# Patient Record
Sex: Male | Born: 1965 | Race: White | Hispanic: No | Marital: Married | Smoking: Former smoker
Health system: Southern US, Community
[De-identification: ages and names within clinical notes are randomized; demographics above are authoritative.]

## PROBLEM LIST (undated history)

## (undated) DIAGNOSIS — I10 Essential (primary) hypertension: Secondary | ICD-10-CM

---

## 2008-01-30 ENCOUNTER — Ambulatory Visit: Payer: Self-pay | Admitting: Family Medicine

## 2014-01-08 ENCOUNTER — Emergency Department (HOSPITAL_COMMUNITY)
Admission: EM | Admit: 2014-01-08 | Discharge: 2014-01-08 | Disposition: A | Discharge status: Home / Self Care | Payer: BC Managed Care – PPO | Attending: Emergency Medicine | Admitting: Emergency Medicine

## 2014-01-08 ENCOUNTER — Encounter (HOSPITAL_COMMUNITY): Payer: Self-pay | Admitting: Emergency Medicine

## 2014-01-08 ENCOUNTER — Emergency Department (HOSPITAL_COMMUNITY): Payer: BC Managed Care – PPO

## 2014-01-08 DIAGNOSIS — R0789 Other chest pain: Secondary | ICD-10-CM

## 2014-01-08 DIAGNOSIS — Z87891 Personal history of nicotine dependence: Secondary | ICD-10-CM | POA: Insufficient documentation

## 2014-01-08 DIAGNOSIS — R079 Chest pain, unspecified: Secondary | ICD-10-CM | POA: Insufficient documentation

## 2014-01-08 DIAGNOSIS — Z79899 Other long term (current) drug therapy: Secondary | ICD-10-CM | POA: Insufficient documentation

## 2014-01-08 DIAGNOSIS — R209 Unspecified disturbances of skin sensation: Secondary | ICD-10-CM | POA: Insufficient documentation

## 2014-01-08 LAB — CBC
HCT: 42.3 % (ref 39.0–52.0)
Hemoglobin: 14.8 g/dL (ref 13.0–17.0)
MCH: 31 pg (ref 26.0–34.0)
MCHC: 35 g/dL (ref 30.0–36.0)
MCV: 88.7 fL (ref 78.0–100.0)
PLATELETS: 271 10*3/uL (ref 150–400)
RBC: 4.77 MIL/uL (ref 4.22–5.81)
RDW: 11.9 % (ref 11.5–15.5)
WBC: 7.4 10*3/uL (ref 4.0–10.5)

## 2014-01-08 LAB — BASIC METABOLIC PANEL
ANION GAP: 12 (ref 5–15)
BUN: 14 mg/dL (ref 6–23)
CALCIUM: 9.9 mg/dL (ref 8.4–10.5)
CO2: 27 mEq/L (ref 19–32)
CREATININE: 0.83 mg/dL (ref 0.50–1.35)
Chloride: 98 mEq/L (ref 96–112)
GFR calc Af Amer: >90 mL/min (ref >90)
GFR calc non Af Amer: >90 mL/min (ref >90)
Glucose, Bld: 99 mg/dL (ref 70–99)
Potassium: 4.1 mEq/L (ref 3.7–5.3)
Sodium: 137 mEq/L (ref 137–147)

## 2014-01-08 LAB — I-STAT TROPONIN, ED
Troponin i, poc: 0 ng/mL (ref 0.00–0.08)
Troponin i, poc: 0 ng/mL (ref 0.00–0.08)

## 2014-01-08 NOTE — Discharge Instructions (Signed)
Please follow up with the [facility] in the next week. They will call you tomorrow with an appointment.  Chest Pain (Nonspecific) It is often hard to give a diagnosis for the cause of chest pain. There is always a chance that your pain could be related to something serious, such as a heart attack or a blood clot in the lungs. You need to follow up with your doctor. HOME CARE  If antibiotic medicine was given, take it as directed by your doctor. Finish the medicine even if you start to feel better.  For the next few days, avoid activities that bring on chest pain. Continue physical activities as told by your doctor.  Do not use any tobacco products. This includes cigarettes, chewing tobacco, and e-cigarettes.  Avoid drinking alcohol.  Only take medicine as told by your doctor.  Follow your doctor's suggestions for more testing if your chest pain does not go away.  Keep all doctor visits you made. GET HELP IF:  Your chest pain does not go away, even after treatment.  You have a rash with blisters on your chest.  You have a fever. GET HELP RIGHT AWAY IF:   You have more pain or pain that spreads to your arm, neck, jaw, back, or belly (abdomen).  You have shortness of breath.  You cough more than usual or cough up blood.  You have very bad back or belly pain.  You feel sick to your stomach (nauseous) or throw up (vomit).  You have very bad weakness.  You pass out (faint).  You have chills. This is an emergency. Do not wait to see if the problems will go away. Call your local emergency services (911 in [place] Do not drive yourself to the hospital. MAKE SURE YOU:   Understand these instructions.  Will watch your condition.  Will get help right away if you are not doing well or get worse. Document Released: 11/11/2007 Document Revised: 05/30/2013 Document Reviewed: 11/11/2007 [facility] Patient Information 2015 [place]. This information is not intended to  replace advice given to you by your health care provider. Make sure you discuss any questions you have with your health care provider.

## 2014-01-08 NOTE — Progress Notes (Signed)
  CARE MANAGEMENT ED NOTE 01/08/2014  Patient:  Dave Hudson,Dave Hudson   Account Number:  [id number]  Date Initiated:  01/08/2014  Documentation initiated by:  Edd ArbourGIBBS,Ausar Georgiou  Subjective/Objective Assessment:   48 yr old bcbs [place] pt went to PCP for chest pain that started today, had numbness and tingling in left arm and face. Pt also states behind his ear in the inside there was has been and itch in conjunction with     Subjective/Objective Assessment Detail:   with chest pain. Pt sent from PCP for labs. pcp per pt is Dr Carilyn Goodpastureibas    Pt states he feels fine when CM visited him, wife and children at bedside Pt recently had lots of visits with a Cancer family member who lost "her fight" Pt states he just wants to make sure he is "okay"     Action/Plan:   updated cp in epic   Action/Plan Detail:   Anticipated DC Date:  01/08/2014     Status Recommendation to Physician:   Result of Recommendation:    Other ED Services  Consult Working Plan    DC Planning Services  Other  PCP issues  Outpatient Services - Pt will follow up    Choice offered to / List presented to:            Status of service:  Completed, signed off  ED Comments:   ED Comments Detail:

## 2014-01-08 NOTE — ED Notes (Addendum)
Pt went to PCP for chest pain that started today, had numbness and tingling in left arm and face. Pt also states behind his ear in the inside there was has been and itch in conjunction with chest pain. Pt sent from PCP for labs.

## 2014-01-08 NOTE — ED Provider Notes (Signed)
CSN: [id number]     Arrival date & time 01/08/14  1232 History   First MD Initiated Contact with Patient 01/08/14 1357     Chief Complaint  Patient presents with  . Chest Pain  . Tingling     (Consider location/radiation/quality/duration/timing/severity/associated sxs/prior Treatment) HPI Comments: Has had a few episodes of chest pain over past 8 months - happened first time about 8 years ago - of outward pressure in the chest, L arm and face tingling. Resolves after about 6 hours. Patient here with similar episode to all his prior episodes of this - no worsening. Sent in by PCP for enzymes and since his BP was elevated.  Patient is a 48 y.o. male presenting with chest pain. The history is provided by the patient.  Chest Pain Pain location:  Substernal area Pain quality: pressure (outward pressure from inside)   Pain radiates to:  Does not radiate Pain radiates to the back: no   Pain severity:  Moderate Onset quality:  Gradual Timing:  Constant Progression:  Resolved Chronicity:  Recurrent Context: at rest   Relieved by:  Nothing Worsened by:  Nothing tried Associated symptoms: no abdominal pain, no cough, no fever, no shortness of breath and not vomiting     History reviewed. No pertinent past medical history. History reviewed. No pertinent past surgical history. No family history on file. History  Substance Use Topics  . Smoking status: Former Games developermoker  . Smokeless tobacco: Not on file  . Alcohol Use: Yes    Review of Systems  Constitutional: Negative for fever and chills.  Respiratory: Negative for cough and shortness of breath.   Cardiovascular: Positive for chest pain. Negative for leg swelling.  Gastrointestinal: Negative for vomiting and abdominal pain.  All other systems reviewed and are negative.     Allergies  Review of patient's allergies indicates no known allergies.  Home Medications   Prior to Admission medications   Medication Sig Start Date End  Date Taking? Authorizing Provider  acetaminophen (TYLENOL) 500 MG tablet Take 500 mg by mouth every 6 (six) hours as needed for moderate pain.   Yes Historical Provider, MD  citalopram (CELEXA) 20 MG tablet Take 20 mg by mouth daily.   Yes Historical Provider, MD  Multiple Vitamin (MULTIVITAMIN WITH MINERALS) TABS tablet Take 1 tablet by mouth daily.   Yes Historical Provider, MD  naproxen sodium (ANAPROX) 220 MG tablet Take 220 mg by mouth every 4 (four) hours as needed (pain).   Yes Historical Provider, MD  Omega-3 Fatty Acids (FISH OIL) 1200 MG CAPS Take 1 capsule by mouth daily.   Yes Historical Provider, MD  pravastatin (PRAVACHOL) 20 MG tablet Take 20 mg by mouth daily.   Yes Historical Provider, MD  Probiotic Product (PROBIOTIC DAILY PO) Take 1 capsule by mouth daily.   Yes Historical Provider, MD   BP 129/86  Pulse 71  Temp(Src) 98.4 F (36.9 C) (Oral)  Resp 16  SpO2 97% Physical Exam  Nursing note and vitals reviewed. Constitutional: He is oriented to person, place, and time. He appears well-developed and well-nourished. No distress.  HENT:  Head: Normocephalic and atraumatic.  Mouth/Throat: Oropharynx is clear and moist. No oropharyngeal exudate.  Eyes: EOM are normal. Pupils are equal, round, and reactive to light.  Neck: Normal range of motion. Neck supple.  Cardiovascular: Normal rate and regular rhythm.  Exam reveals no friction rub.   No murmur heard. Pulmonary/Chest: Effort normal and breath sounds normal. No respiratory distress. He has  no wheezes. He has no rales.  Abdominal: Soft. He exhibits no distension. There is no tenderness. There is no rebound.  Musculoskeletal: Normal range of motion. He exhibits no edema.  Neurological: He is alert and oriented to person, place, and time.  Skin: No rash noted. He is not diaphoretic.    ED Course  Procedures (including critical care time) Labs Review Labs Reviewed  CBC  BASIC METABOLIC PANEL  I-STAT TROPOININ, ED   Rosezena Sensor, ED    Imaging Review Dg Chest 2 View  01/08/2014   CLINICAL DATA:  Chest pain, former smoker  EXAM: CHEST  2 VIEW  COMPARISON:  None.  FINDINGS: The heart size and mediastinal contours are within normal limits. Both lungs are clear. The visualized skeletal structures are unremarkable.  IMPRESSION: No active cardiopulmonary disease.   Electronically Signed   By: Christiana Pellant M.D.   On: 01/08/2014 14:51     EKG Interpretation   Date/Time:  Monday January 08 2014 12:56:17 EDT Ventricular Rate:  55 PR Interval:  152 QRS Duration: 93 QT Interval:  435 QTC Calculation: 416 R Axis:   29 Text Interpretation:  Sinus rhythm Probable anteroseptal infarct, old  Baseline wander in lead(s) V2 No prior Confirmed by Gwendolyn Grant  MD, Adalea Handler  ([id number]) on 01/08/2014 2:20:21 PM      MDM   Final diagnoses:  Other chest pain    48 year old male here with atypical chest pain. All he has risk factors of hyperlipidemia and family history. He is not a smoker. Over the past several years he's had episodes like this, more frequently, for 5 times over the past 8 months. Episode lasted for 6 hours, described as outward pressure on his chest. He has associated left arm tingling and facial tingling. This all resolves after the chest pain resolves. This is following the same course as it has on every other time this has happened. Patient sent in today by his doctor because of elevated blood pressure, 160s over 100. Pressures normal here. No chest pain this time. He is describing an atypical picture. His EKG is normal and his first troponin is normal. We'll repeat second troponin. Cardiology contacted and will followup with appointment tomorrow. Second troponin negative. Exam okay. Stable for discharge.    Dagmar Hait, MD 01/08/14 [phone]

## 2014-01-30 ENCOUNTER — Encounter: Payer: Self-pay | Admitting: Cardiovascular Disease

## 2014-01-30 ENCOUNTER — Ambulatory Visit (INDEPENDENT_AMBULATORY_CARE_PROVIDER_SITE_OTHER): Payer: BC Managed Care – PPO | Admitting: Cardiovascular Disease

## 2014-01-30 VITALS — BP 134/100 | HR 80 | Ht 70.0 in | Wt 207.8 lb

## 2014-01-30 DIAGNOSIS — R079 Chest pain, unspecified: Secondary | ICD-10-CM | POA: Insufficient documentation

## 2014-01-30 DIAGNOSIS — R0789 Other chest pain: Secondary | ICD-10-CM

## 2014-01-30 DIAGNOSIS — I10 Essential (primary) hypertension: Secondary | ICD-10-CM

## 2014-01-30 DIAGNOSIS — R9431 Abnormal electrocardiogram [ECG] [EKG]: Secondary | ICD-10-CM

## 2014-01-30 DIAGNOSIS — E785 Hyperlipidemia, unspecified: Secondary | ICD-10-CM

## 2014-01-30 MED ORDER — POTASSIUM CHLORIDE ER 10 MEQ PO TBCR: 10.0000 meq | EXTENDED_RELEASE_TABLET | Freq: Every day | ORAL | Status: DC

## 2014-01-30 MED ORDER — HYDROCHLOROTHIAZIDE 25 MG PO TABS: 25.0000 mg | ORAL_TABLET | Freq: Every day | ORAL | Status: DC

## 2014-01-30 NOTE — Progress Notes (Signed)
Kenyon Ana Date of Birth  1966/04/02       [facility]    [place] [address]  [address] [place], [place]  [address]   [place], [place]  [address] [phone]     [phone]   Fax  [phone]     Fax [phone]  Problem List: 1. Chest pain 2. HTN 3. Hyperlipidemia   History of Present Illness:  Nasim presents today for further eval of his CP and HTN.    He has occasional episodes of chest tightness - similar to when you might swallow a pill that is too large and it scratches on the way down.  He also has left arm numbness associated with the CP.   He went to the ER and Troponin was negative.   CXR was negative.   He was observed for several hours.   This occurs sporadically over the past couple of years.   His BP has been higher over the past couple of months.   Her has had hyperlipidemia for years   He has a   family hx of CAD. His health is overall very good.    He is active - exercises irregularly 2-3 days a week , walking .  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 acetaminophen (TYLENOL) 500 MG tablet Take 500 mg by mouth every 6 (six) hours as needed for moderate pain.      . citalopram (CELEXA) 20 MG tablet Take 20 mg by mouth daily.      . Multiple Vitamin (MULTIVITAMIN WITH MINERALS) TABS tablet Take 1 tablet by mouth daily.      . Omega-3 Fatty Acids (FISH OIL) 1200 MG CAPS Take 1 capsule by mouth daily.      . pravastatin (PRAVACHOL) 20 MG tablet Take 20 mg by mouth daily.       No current facility-administered medications on file prior to visit.    No Known Allergies  No past medical history on file.  No past surgical history on  file.  History  Smoking status  . Former Smoker  Smokeless tobacco  . Not on file    History  Alcohol Use  . Yes    No family history on file.  Reviw of Systems:  Reviewed in the HPI.  All other systems are negative.  Physical Exam: Blood pressure 134/100, pulse 80, height  (1.778 m), weight 207 lb 12.8 oz (94.257 kg). Wt Readings from Last 3 Encounters:  01/30/14 207 lb 12.8 oz (94.257 kg)     General: Well developed, well nourished, in no acute distress.  Head: Normocephalic, atraumatic, sclera non-icteric, mucus membranes are moist,   Neck: Supple. Carotids are 2 + without bruits. No JVD   Lungs: Clear   Heart: RR, normal S1S2  Abdomen: Soft, non-tender, non-distended with normal bowel sounds.  Msk:  Strength and tone are normal   Extremities: No clubbing or cyanosis. No edema.  Distal pedal pulses are 2+ and equal    Neuro: CN II - XII intact.  Alert and oriented X 3.   Psych:  Normal   ECG: 01/09/14:  Sinus brady at 55.  Poor R wave progression.   Assessment / Plan:

## 2014-01-30 NOTE — Assessment & Plan Note (Signed)
Dave Hudson presents today for further evaluation of some episodes of chest tightness. These seem to occur at rest and occur at different occasions. They're not necessarily associated with exertion. He does have a history of hyperlipidemia, hypertension, and a family history of carotid disease. He was recently evaluated in the emergency room and he is poor R wave progression consistent with possible anterior wall myocardial infarction.  At this point we will schedule him for a stress Myoview study. I think we need to be more aggressive with his cholesterol management. He's currently on Pravachol but he knows that his numbers are not well controlled. We'll get a fasting lipids in several days and I anticipate starting him on atorvastatin.

## 2014-01-30 NOTE — Patient Instructions (Addendum)
Your physician has recommended you make the following change in your medication:  START HCTZ (Hydrochlorothiazide) 25 mg once daily - take in the morning START KDur (potassium supplement) 10 meq once daily - take with the HCTZ  Your physician has requested that you have en exercise stress myoview. For further information please visit [email or web address] Please follow instruction sheet, as given.  Your physician recommends that you return for lab work on:  Thursday 8/27 anytime between 7:30 am and 5:00 pm You will need to FAST for this appointment - nothing to eat or drink after midnight the night before except water.  Your physician recommends that you schedule a follow-up appointment in: 4-6 WEEKS with Dr. Elease Hashimoto.  You will also need to get lab work on this day (Basic Metabolic Panel)

## 2014-01-30 NOTE — Assessment & Plan Note (Signed)
His diastolic blood pressures been elevated for the past several months. He has gained a little bit of weight which is at least partially responsible for this hypertension. He does not eat a lot of salt. We'll start him on HCTZ 25 mg a day and potassium chloride 10 mg a day. I will see   him in 4-6 weeks for followup office visit and basic metabolic profile.

## 2014-02-01 ENCOUNTER — Other Ambulatory Visit (INDEPENDENT_AMBULATORY_CARE_PROVIDER_SITE_OTHER): Payer: BC Managed Care – PPO

## 2014-02-01 DIAGNOSIS — E785 Hyperlipidemia, unspecified: Secondary | ICD-10-CM

## 2014-02-01 DIAGNOSIS — R0789 Other chest pain: Secondary | ICD-10-CM

## 2014-02-01 DIAGNOSIS — I10 Essential (primary) hypertension: Secondary | ICD-10-CM

## 2014-02-01 LAB — LIPID PANEL
Cholesterol: 199 mg/dL (ref 0–200)
HDL: 30.3 mg/dL — AB (ref >39.00)
NONHDL: 168.7
TRIGLYCERIDES: 279 mg/dL — AB (ref 0.0–149.0)
Total CHOL/HDL Ratio: 7
VLDL: 55.8 mg/dL — ABNORMAL HIGH (ref 0.0–40.0)

## 2014-02-01 LAB — BASIC METABOLIC PANEL
BUN: 13 mg/dL (ref 6–23)
CALCIUM: 9.4 mg/dL (ref 8.4–10.5)
CO2: 30 mEq/L (ref 19–32)
CREATININE: 0.9 mg/dL (ref 0.4–1.5)
Chloride: 100 mEq/L (ref 96–112)
GFR: 101.1 mL/min (ref >60.00)
GLUCOSE: 98 mg/dL (ref 70–99)
Potassium: 4 mEq/L (ref 3.5–5.1)
Sodium: 136 mEq/L (ref 135–145)

## 2014-02-01 LAB — HEPATIC FUNCTION PANEL
ALBUMIN: 4.1 g/dL (ref 3.5–5.2)
ALT: 49 U/L (ref 0–53)
AST: 36 U/L (ref 0–37)
Alkaline Phosphatase: 46 U/L (ref 39–117)
Bilirubin, Direct: 0.1 mg/dL (ref 0.0–0.3)
TOTAL PROTEIN: 7.5 g/dL (ref 6.0–8.3)
Total Bilirubin: 1 mg/dL (ref 0.2–1.2)

## 2014-02-01 LAB — LDL CHOLESTEROL, DIRECT: Direct LDL: 120.3 mg/dL

## 2014-02-07 ENCOUNTER — Ambulatory Visit (HOSPITAL_COMMUNITY): Payer: BC Managed Care – PPO | Attending: Internal Medicine | Admitting: Radiology

## 2014-02-07 ENCOUNTER — Other Ambulatory Visit: Payer: Self-pay | Admitting: Nurse Practitioner

## 2014-02-07 VITALS — BP 134/93 | HR 51 | Ht 70.0 in | Wt 204.0 lb

## 2014-02-07 DIAGNOSIS — Z8249 Family history of ischemic heart disease and other diseases of the circulatory system: Secondary | ICD-10-CM | POA: Diagnosis not present

## 2014-02-07 DIAGNOSIS — R9431 Abnormal electrocardiogram [ECG] [EKG]: Secondary | ICD-10-CM | POA: Diagnosis not present

## 2014-02-07 DIAGNOSIS — R5381 Other malaise: Secondary | ICD-10-CM | POA: Insufficient documentation

## 2014-02-07 DIAGNOSIS — R5383 Other fatigue: Secondary | ICD-10-CM

## 2014-02-07 DIAGNOSIS — R0789 Other chest pain: Secondary | ICD-10-CM | POA: Diagnosis present

## 2014-02-07 MED ORDER — TECHNETIUM TC 99M SESTAMIBI GENERIC - CARDIOLITE
30.0000 | Freq: Once | INTRAVENOUS | Status: AC | PRN
  Administered 2014-02-07: 30 via INTRAVENOUS

## 2014-02-07 MED ORDER — ATORVASTATIN CALCIUM 40 MG PO TABS: 40.0000 mg | ORAL_TABLET | Freq: Every day | ORAL | Status: DC

## 2014-02-07 MED ORDER — TECHNETIUM TC 99M SESTAMIBI GENERIC - CARDIOLITE
10.0000 | Freq: Once | INTRAVENOUS | Status: AC | PRN
  Administered 2014-02-07: 10 via INTRAVENOUS

## 2014-02-07 NOTE — Progress Notes (Signed)
[facility] SITE 3 NUCLEAR MED [address] [place], [place] [address] [phone]    Cardiology Nuclear Med Study  Dave Hudson is a 48 y.o. male     MRN : [id number]     DOB: June 23, 1965  Procedure Date: 02/07/2014  Nuclear Med Background Indication for Stress Test:  Evaluation for Ischemia History:  No known CAD Cardiac Risk Factors: Family History - CAD, History of Smoking, Hypertension and Lipids  Symptoms:  Chest Tightness (last date of chest discomfort was two weeks ago)   Nuclear Pre-Procedure Caffeine/Decaff Intake:  None NPO After: 6 pm   Lungs:  clear O2 Sat: 95% on room air. IV 0.9% NS with Angio Cath:  22g  IV Site: R Hand  IV Started by:  Bonnita Levan, RN  Chest Size (in):  46 Cup Size: n/a  Height:  (1.778 m)  Weight:  204 lb (92.534 kg)  BMI:  Body mass index is 29.27 kg/(m^2). Tech Comments:  N/A    Nuclear Med Study 1 or 2 day study: 1 day  Stress Test Type:  Stress  Reading MD: N/A  Order Authorizing Provider:  Kristeen Miss, MD  Resting Radionuclide: Technetium 84m Sestamibi  Resting Radionuclide Dose: 11.0 mCi   Stress Radionuclide:  Technetium 82m Sestamibi  Stress Radionuclide Dose: 33.0 mCi           Stress Protocol Rest HR: 51 Stress HR: 155  Rest BP: 134/93 Stress BP: 178/99  Exercise Time (min): 12:00 METS: 13.4           Dose of Adenosine (mg):  n/a Dose of Lexiscan: n/a mg  Dose of Atropine (mg): n/a Dose of Dobutamine: n/a mcg/kg/min (at max HR)  Stress Test Technologist: Nelson Chimes, BS-ES  Nuclear Technologist:  Harlow Asa, CNMT     Rest Procedure:  Myocardial perfusion imaging was performed at rest 45 minutes following the intravenous administration of Technetium 21m Sestamibi. Rest ECG: NSR - Normal EKG  Stress Procedure:  The patient exercised on the treadmill utilizing the Bruce Protocol for 12:00 minutes. The patient stopped due to fatigue and denied any chest pain.  Technetium 2m Sestamibi was  injected at peak exercise and myocardial perfusion imaging was performed after a brief delay. Stress ECG: No significant change from baseline ECG  QPS Raw Data Images:  There is a moderate amount of motion artifact.  Stress Images:  Normal homogeneous uptake in all areas of the myocardium. Rest Images:  Normal homogeneous uptake in all areas of the myocardium. Subtraction (SDS):  No evidence of ischemia. Transient Ischemic Dilatation (Normal <1.22):  0.91 Lung/Heart Ratio (Normal <0.45):  0.40  Quantitative Gated Spect Images QGS EDV:  106 ml QGS ESV:  51 ml  Impression Exercise Capacity:  Excellent exercise capacity. BP Response:  Normal blood pressure response. Clinical Symptoms:  No significant symptoms noted. ECG Impression:  No significant ST segment change suggestive of ischemia. Comparison with Prior Nuclear Study: No images to compare  Overall Impression:  Normal stress nuclear study.  LV Ejection Fraction: 52%.  LV Wall Motion:  NL LV Function; NL Wall Motion.   Dave Hudson, Dave Hudson., MD, [facility] 02/07/2014, 5:05 PM [address],  [address] Office [phone] Pager [phone]

## 2014-02-07 NOTE — Progress Notes (Deleted)
OUTPATIENT IMAGING DISCHARGE INSTRUCTIONS   You can leave immediately.   Your activity will not be restricted.  You may drive if you wish, resume your normal diet, exercise and take all prescribed medications.  As a reminder, it is very important that you carry medication information at all times in the event of emergency situations.   The Nuclear / Echo study will be reviewed a physician who will send a report to your doctor.   Your doctor will contact you about the results of your procedure.

## 2014-02-08 ENCOUNTER — Telehealth: Payer: Self-pay | Admitting: Nurse Practitioner

## 2014-02-08 NOTE — Telephone Encounter (Signed)
Discussed results of myoview with patient and we discussed treatment plan from this point forward.  I reviewed Dr. Harvie Bridge plan to get the test results and to see how patient responds to HCTZ that was prescribed.  Patient states he believes his pain is related to stress and that his BP will get better with this medication, some dietary changes, and some stress relieving techniques.  Patient is scheduled for f/u on 10/7.  I advised patient to call back with questions or problems including increase in episodes, frequency, or intensity of chest pain or other concerns.  Patient verbalized understanding and agreement.

## 2014-02-08 NOTE — Telephone Encounter (Signed)
Message copied by Levi Aland on Thu Feb 08, 2014  2:08 PM ------      Message from: Vesta Mixer      Created: Thu Feb 08, 2014  9:58 AM       Normal myovie      w ------

## 2014-03-14 ENCOUNTER — Ambulatory Visit (INDEPENDENT_AMBULATORY_CARE_PROVIDER_SITE_OTHER): Payer: BC Managed Care – PPO | Admitting: Cardiovascular Disease

## 2014-03-14 ENCOUNTER — Encounter: Payer: Self-pay | Admitting: Cardiovascular Disease

## 2014-03-14 VITALS — BP 126/86 | HR 63 | Ht 70.0 in | Wt 211.0 lb

## 2014-03-14 DIAGNOSIS — E785 Hyperlipidemia, unspecified: Secondary | ICD-10-CM

## 2014-03-14 DIAGNOSIS — I10 Essential (primary) hypertension: Secondary | ICD-10-CM

## 2014-03-14 DIAGNOSIS — R0789 Other chest pain: Secondary | ICD-10-CM

## 2014-03-14 LAB — BASIC METABOLIC PANEL
BUN: 13 mg/dL (ref 6–23)
CO2: 26 mEq/L (ref 19–32)
Calcium: 9.5 mg/dL (ref 8.4–10.5)
Chloride: 102 mEq/L (ref 96–112)
Creatinine, Ser: 0.9 mg/dL (ref 0.4–1.5)
GFR: 102.43 mL/min (ref >60.00)
Glucose, Bld: 108 mg/dL — ABNORMAL HIGH (ref 70–99)
Potassium: 4.6 mEq/L (ref 3.5–5.1)
Sodium: 138 mEq/L (ref 135–145)

## 2014-03-14 NOTE — Assessment & Plan Note (Signed)
No further CP. myoview was negative

## 2014-03-14 NOTE — Assessment & Plan Note (Signed)
BP is fine.  Tolerating HCTZ well. Will check BMP today

## 2014-03-14 NOTE — Patient Instructions (Addendum)
Your physician wants you to follow-up in: 6 months with Dr. Elease HashimotoNahser. You will receive a reminder letter in the mail two months in advance. If you don't receive a letter, please call our office to schedule the follow-up appointment.  Your physician recommends that you have for lab work TODAY (BMET).  Your physician recommends that you continue on your current medications as directed. Please refer to the Current Medication list given to you today.

## 2014-03-14 NOTE — Progress Notes (Signed)
Dave Hudson Date of Birth  05/03/1966       [facility]    [place] [facility] [address]  [address] [place], [place]  [address]   [place], [place]  [id number] [phone]     [phone]   Fax  [phone]     Fax [phone]  Problem List: 1. Chest pain 2. HTN 3. Hyperlipidemia   History of Present Illness:  Dave Hudson presents today for further eval of his CP and HTN.    He has occasional episodes of chest tightness - similar to when you might swallow a pill that is too large and it scratches on the way down.  He also has left arm numbness associated with the CP.   He went to the ER and Troponin was negative.   CXR was negative.   He was observed for several hours.   This occurs sporadically over the past couple of years.   His BP has been higher over the past couple of months.   Her has had hyperlipidemia for years   He has a   family hx of CAD. His health is overall very good.    He is active - exercises irregularly 2-3 days a week , walking .  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  Oct. 7, 2015:  Dave Hudson is doing better.  We saw him in August for HTN and CP.  Added HCTZ.   Stress myoview was normal.  LV EF = 52%.   Watching his salt.  Tolerating the HCTZ well.  Drinking more water.  He has noticed that his  CP seems to be positional - last night had CP while lying in bed.   Resolved with change of position.  Current Outpatient Prescriptions on File Prior to Visit  Medication Sig Dispense Refill  . acetaminophen (TYLENOL) 500 MG tablet Take 500 mg by mouth every 6 (six) hours as needed for moderate pain.      Marland Kitchen. atorvastatin (LIPITOR) 40 MG tablet Take 1 tablet (40 mg total) by mouth daily at 6 PM.  90 tablet  3  . citalopram (CELEXA) 20 MG tablet  Take 20 mg by mouth daily.      . hydrochlorothiazide (HYDRODIURIL) 25 MG tablet Take 1 tablet (25 mg total) by mouth daily.  30 tablet  11  . Multiple Vitamin (MULTIVITAMIN WITH MINERALS) TABS tablet Take 1 tablet by mouth daily.      . Omega-3 Fatty Acids (FISH OIL) 1200 MG CAPS Take 1 capsule by mouth daily.      . potassium chloride (K-DUR) 10 MEQ tablet Take 1 tablet (10 mEq total) by mouth daily.  30 tablet  11   No current facility-administered medications on file prior to visit.    No Known Allergies  No past medical history on file.  No past surgical history on file.  History  Smoking status  . Former Smoker  Smokeless tobacco  . Not on file    History  Alcohol Use  . Yes    No family history on file.  Reviw of Systems:  Reviewed in the HPI.  All other systems are negative.  Physical Exam: Blood pressure 126/86, pulse 63, height 5\' 10"  (1.778 m), weight 211 lb (95.709 kg).  Wt Readings from Last 3 Encounters:  03/14/14 211 lb (95.709 kg)  02/07/14 204 lb (92.534 kg)  01/30/14 207 lb 12.8 oz (94.257 kg)     General: Well developed, well nourished, in no acute distress.  Head: Normocephalic, atraumatic, sclera non-icteric, mucus membranes are moist,   Neck: Supple. Carotids are 2 + without bruits. No JVD   Lungs: Clear   Heart: RR, normal S1S2  Abdomen: Soft, non-tender, non-distended with normal bowel sounds.  Msk:  Strength and tone are normal   Extremities: No clubbing or cyanosis. No edema.  Distal pedal pulses are 2+ and equal    Neuro: CN II - XII intact.  Alert and oriented X 3.   Psych:  Normal   ECG: 01/09/14:  Sinus brady at 55. Poor R wave progression.   Assessment / Plan:

## 2014-03-14 NOTE — Assessment & Plan Note (Signed)
Trigs are elevated. On fish oil capsules. Will continue diet, exercise program Will try to lose weight. i will check lipids again in 6 months with his next OV

## 2014-05-10 ENCOUNTER — Other Ambulatory Visit (INDEPENDENT_AMBULATORY_CARE_PROVIDER_SITE_OTHER): Payer: BC Managed Care – PPO | Admitting: *Deleted

## 2014-05-10 DIAGNOSIS — I10 Essential (primary) hypertension: Secondary | ICD-10-CM

## 2014-05-10 DIAGNOSIS — E785 Hyperlipidemia, unspecified: Secondary | ICD-10-CM

## 2014-05-10 LAB — LIPID PANEL
CHOLESTEROL: 167 mg/dL (ref 0–200)
HDL: 31.1 mg/dL — AB (ref >39.00)
NonHDL: 135.9
TRIGLYCERIDES: 211 mg/dL — AB (ref 0.0–149.0)
Total CHOL/HDL Ratio: 5
VLDL: 42.2 mg/dL — ABNORMAL HIGH (ref 0.0–40.0)

## 2014-05-10 LAB — BASIC METABOLIC PANEL
BUN: 12 mg/dL (ref 6–23)
CALCIUM: 9.5 mg/dL (ref 8.4–10.5)
CO2: 28 mEq/L (ref 19–32)
CREATININE: 1 mg/dL (ref 0.4–1.5)
Chloride: 101 mEq/L (ref 96–112)
GFR: 87.89 mL/min (ref >60.00)
GLUCOSE: 108 mg/dL — AB (ref 70–99)
POTASSIUM: 3.8 meq/L (ref 3.5–5.1)
Sodium: 138 mEq/L (ref 135–145)

## 2014-05-12 LAB — LDL CHOLESTEROL, DIRECT: LDL DIRECT: 96.3 mg/dL

## 2014-05-13 LAB — HEPATIC FUNCTION PANEL
ALBUMIN: 4.6 g/dL (ref 3.5–5.2)
ALT: 87 U/L — ABNORMAL HIGH (ref 0–53)
AST: 47 U/L — ABNORMAL HIGH (ref 0–37)
Alkaline Phosphatase: 53 U/L (ref 39–117)
BILIRUBIN TOTAL: 0.9 mg/dL (ref 0.2–1.2)
Bilirubin, Direct: 0.1 mg/dL (ref 0.0–0.3)
Total Protein: 7.6 g/dL (ref 6.0–8.3)

## 2015-02-08 ENCOUNTER — Other Ambulatory Visit: Payer: Self-pay | Admitting: Cardiovascular Disease

## 2015-03-06 ENCOUNTER — Other Ambulatory Visit: Payer: Self-pay | Admitting: Cardiovascular Disease

## 2015-03-17 ENCOUNTER — Other Ambulatory Visit: Payer: Self-pay | Admitting: Cardiovascular Disease

## 2015-05-04 ENCOUNTER — Other Ambulatory Visit: Payer: Self-pay | Admitting: Cardiovascular Disease

## 2015-05-12 ENCOUNTER — Other Ambulatory Visit: Payer: Self-pay | Admitting: Cardiovascular Disease

## 2015-06-19 IMAGING — CR DG CHEST 2V
2 series · 2 of 2 positions shown · non-contrast
Comparison: None.

CLINICAL DATA: Chest pain, former smoker

EXAM:
CHEST  2 VIEW

[w chest pa]
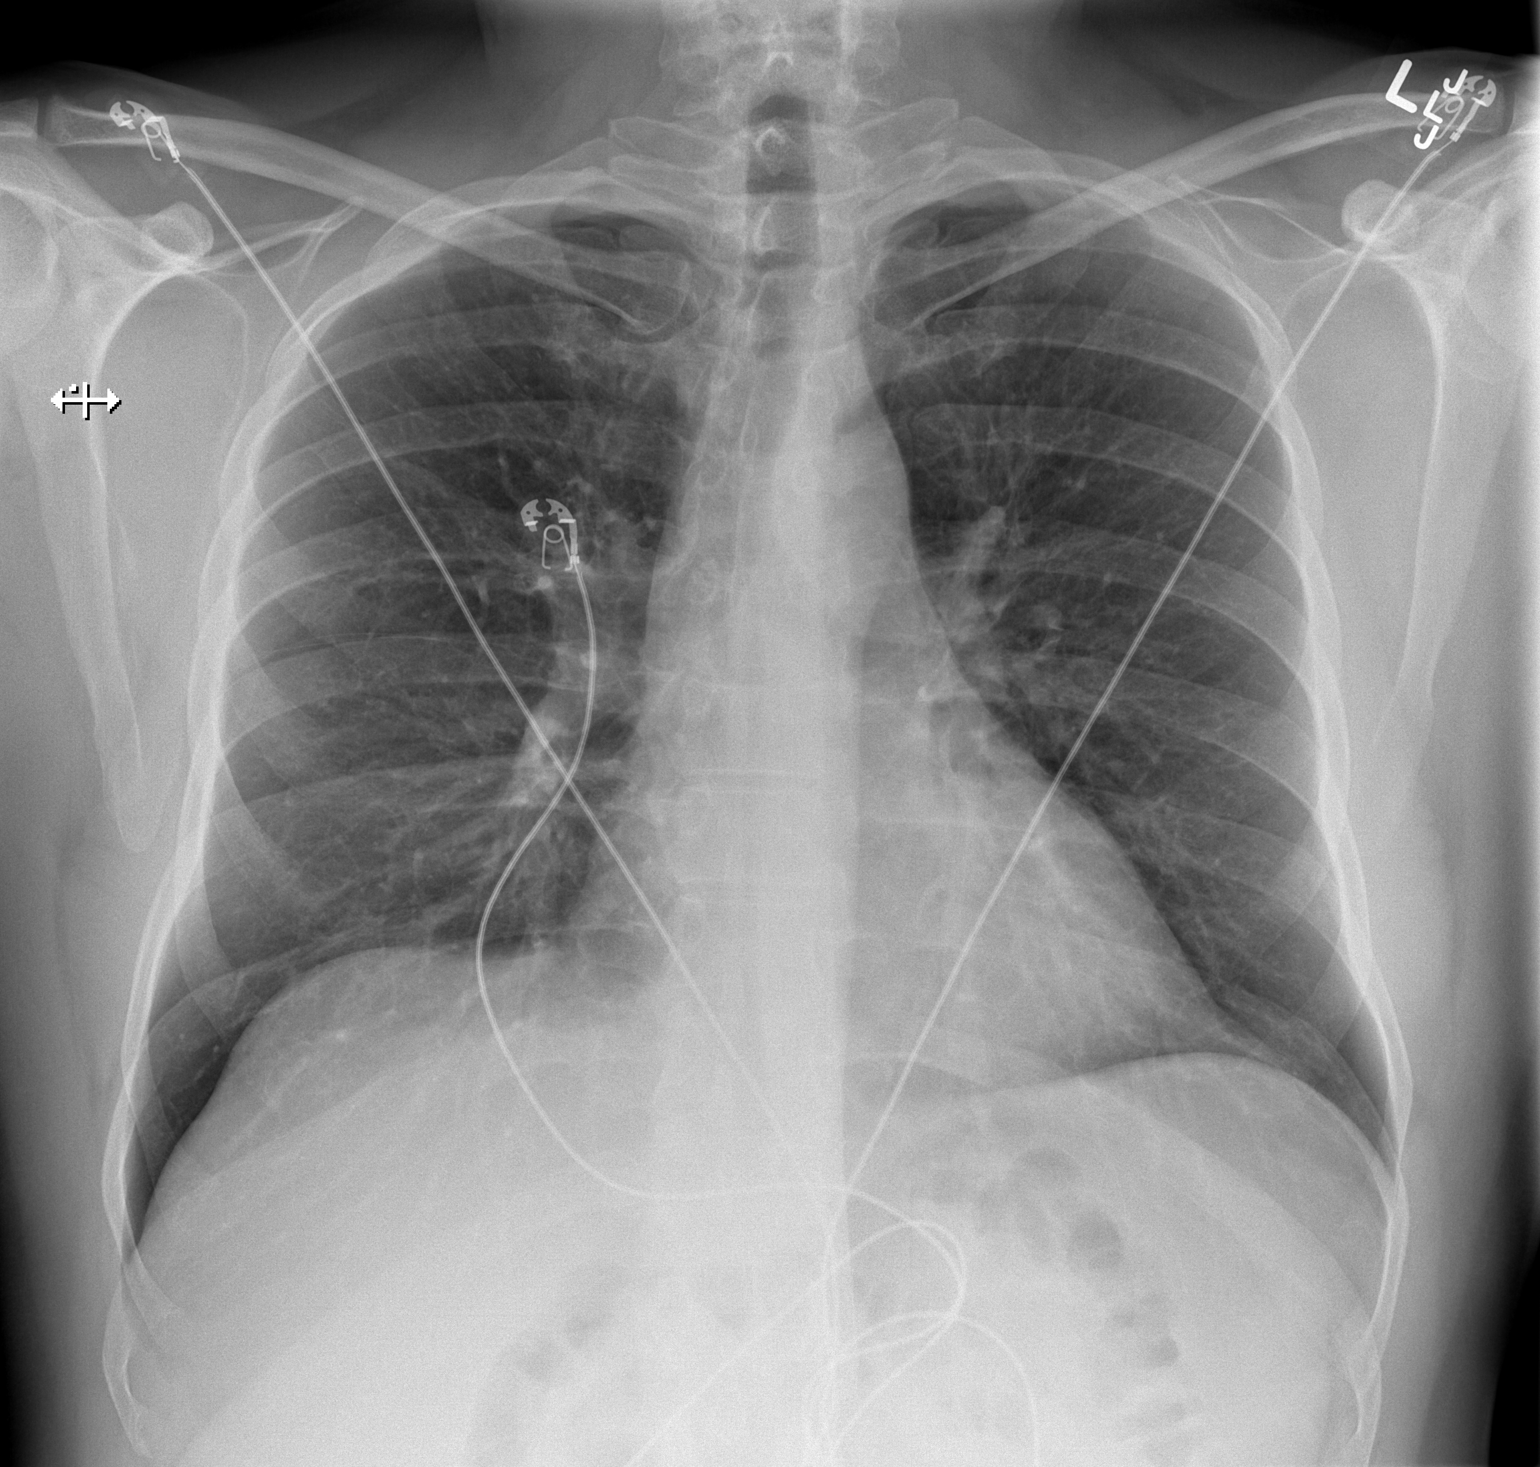

[w chest lat]
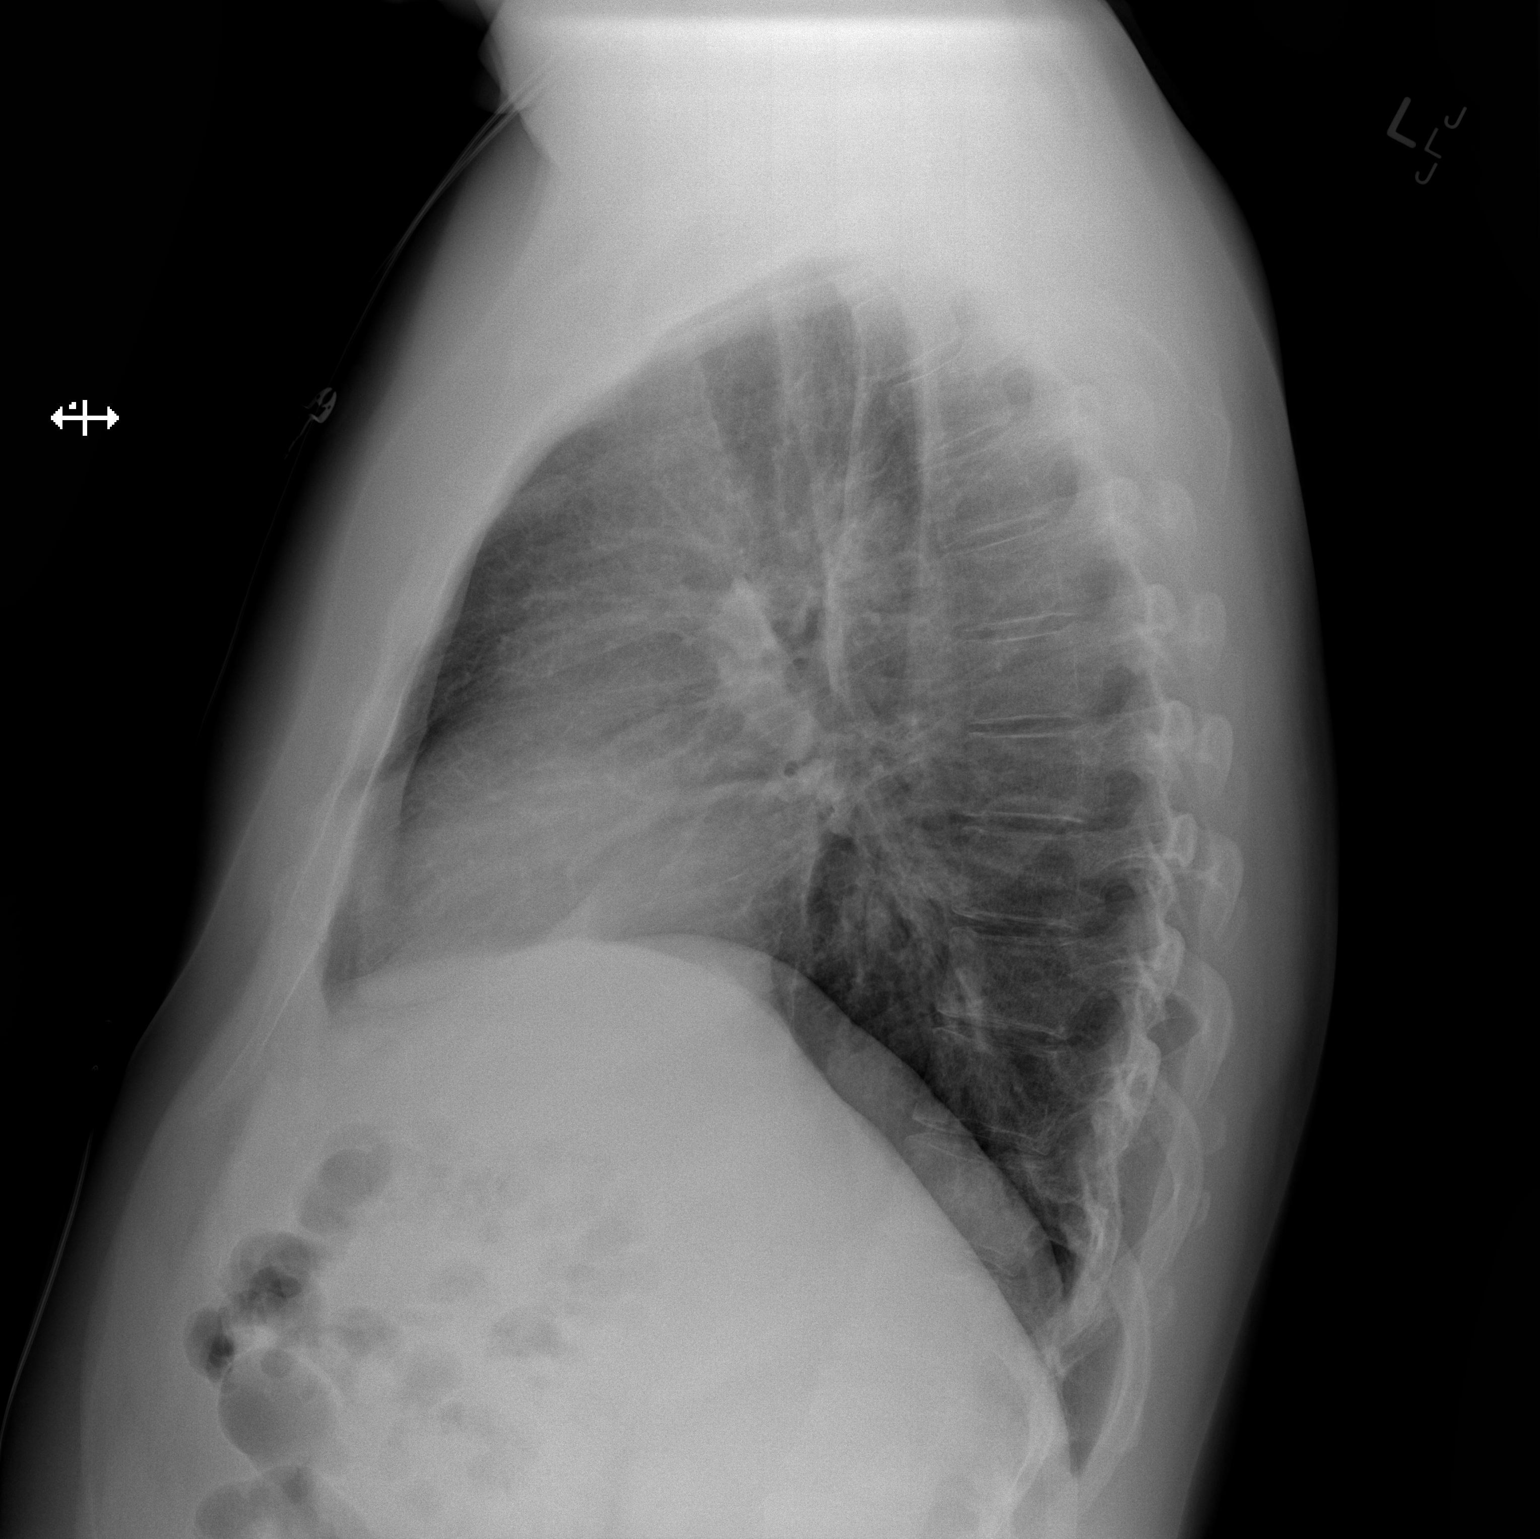

[2 of 2 positions shown; findings below may reference images not displayed]

FINDINGS: The heart size and mediastinal contours are within normal limits.
Both lungs are clear. The visualized skeletal structures are
unremarkable.
IMPRESSION: No active cardiopulmonary disease.

## 2015-12-17 ENCOUNTER — Ambulatory Visit
Admission: RE | Admit: 2015-12-17 | Discharge: 2015-12-17 | Disposition: A | Discharge status: Home / Self Care | Payer: Self-pay | Source: Ambulatory Visit | Attending: Family Medicine | Admitting: Family Medicine

## 2015-12-17 ENCOUNTER — Other Ambulatory Visit: Payer: Self-pay | Admitting: Family Medicine

## 2015-12-17 DIAGNOSIS — M7989 Other specified soft tissue disorders: Secondary | ICD-10-CM

## 2017-04-07 DIAGNOSIS — Z79899 Other long term (current) drug therapy: Secondary | ICD-10-CM | POA: Diagnosis not present

## 2017-04-07 DIAGNOSIS — E7800 Pure hypercholesterolemia, unspecified: Secondary | ICD-10-CM | POA: Diagnosis not present

## 2017-04-07 DIAGNOSIS — B351 Tinea unguium: Secondary | ICD-10-CM | POA: Diagnosis not present

## 2017-04-07 DIAGNOSIS — R1310 Dysphagia, unspecified: Secondary | ICD-10-CM | POA: Diagnosis not present

## 2017-04-07 DIAGNOSIS — F322 Major depressive disorder, single episode, severe without psychotic features: Secondary | ICD-10-CM | POA: Diagnosis not present

## 2017-05-19 DIAGNOSIS — R740 Nonspecific elevation of levels of transaminase and lactic acid dehydrogenase [LDH]: Secondary | ICD-10-CM | POA: Diagnosis not present

## 2017-05-19 DIAGNOSIS — E7800 Pure hypercholesterolemia, unspecified: Secondary | ICD-10-CM | POA: Diagnosis not present

## 2017-05-27 IMAGING — CR DG TOE GREAT 2+V*R*
2 series · 2 of 2 positions shown · non-contrast
Comparison: None.

CLINICAL DATA: Injury to right great toe. Stubbed toe. Swelling and
bruising at PIP joints

EXAM:
RIGHT GREAT TOE

[view not recorded (1 of 2)]
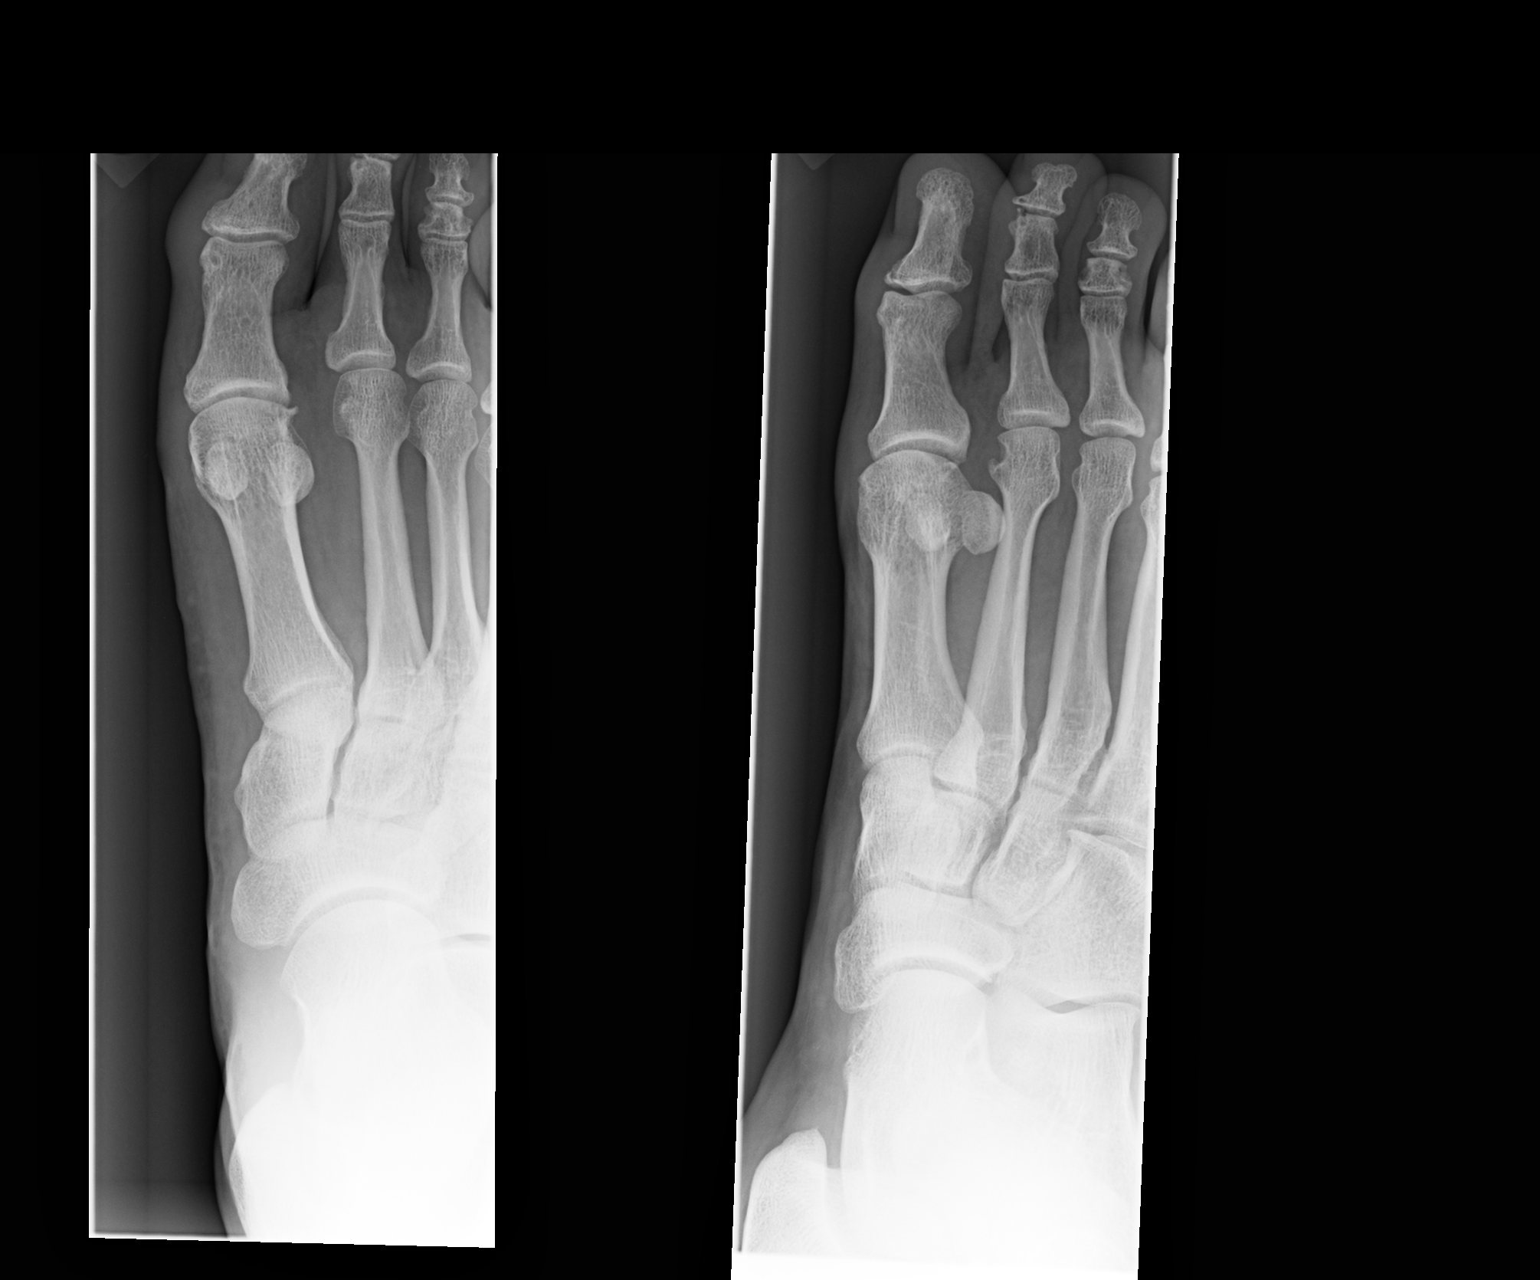

[view not recorded (2 of 2)]
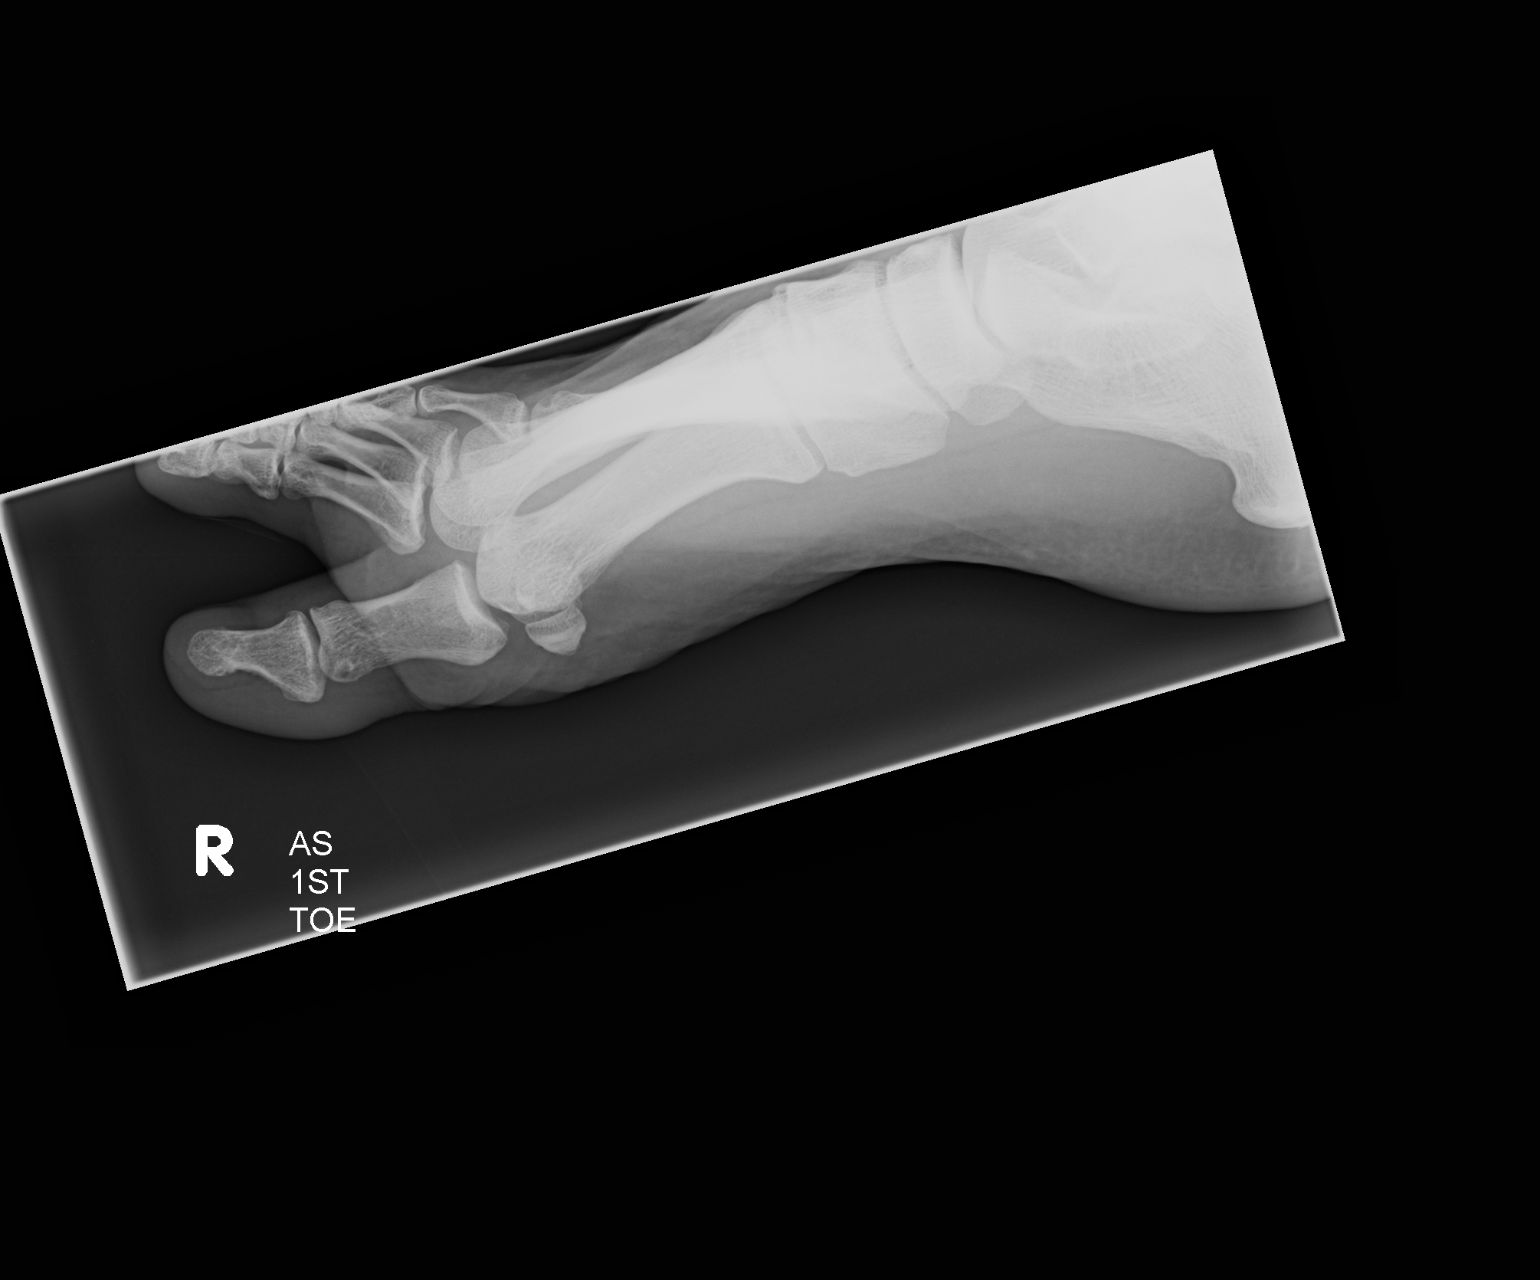

[2 of 2 positions shown; findings below may reference images not displayed]

FINDINGS: Mild degenerative changes at the first MTP joint. No acute bony
abnormality. Specifically, no fracture, subluxation, or dislocation.
Soft tissues are intact.
IMPRESSION: No acute bony abnormality.

## 2017-05-31 ENCOUNTER — Other Ambulatory Visit: Payer: Self-pay | Admitting: Family Medicine

## 2017-05-31 DIAGNOSIS — R740 Nonspecific elevation of levels of transaminase and lactic acid dehydrogenase [LDH]: Principal | ICD-10-CM

## 2017-05-31 DIAGNOSIS — R7401 Elevation of levels of liver transaminase levels: Secondary | ICD-10-CM

## 2017-10-13 DIAGNOSIS — E7800 Pure hypercholesterolemia, unspecified: Secondary | ICD-10-CM | POA: Diagnosis not present

## 2017-10-13 DIAGNOSIS — Z0000 Encounter for general adult medical examination without abnormal findings: Secondary | ICD-10-CM | POA: Diagnosis not present

## 2017-10-13 DIAGNOSIS — Z125 Encounter for screening for malignant neoplasm of prostate: Secondary | ICD-10-CM | POA: Diagnosis not present

## 2017-10-13 DIAGNOSIS — Z23 Encounter for immunization: Secondary | ICD-10-CM | POA: Diagnosis not present

## 2017-12-31 DIAGNOSIS — K635 Polyp of colon: Secondary | ICD-10-CM | POA: Diagnosis not present

## 2017-12-31 DIAGNOSIS — Z1211 Encounter for screening for malignant neoplasm of colon: Secondary | ICD-10-CM | POA: Diagnosis not present

## 2018-01-04 DIAGNOSIS — K635 Polyp of colon: Secondary | ICD-10-CM | POA: Diagnosis not present

## 2018-01-04 DIAGNOSIS — Z1211 Encounter for screening for malignant neoplasm of colon: Secondary | ICD-10-CM | POA: Diagnosis not present

## 2018-01-27 DIAGNOSIS — M25532 Pain in left wrist: Secondary | ICD-10-CM | POA: Diagnosis not present

## 2018-01-27 DIAGNOSIS — H6692 Otitis media, unspecified, left ear: Secondary | ICD-10-CM | POA: Diagnosis not present

## 2018-01-27 DIAGNOSIS — J019 Acute sinusitis, unspecified: Secondary | ICD-10-CM | POA: Diagnosis not present

## 2018-11-07 DIAGNOSIS — Z0000 Encounter for general adult medical examination without abnormal findings: Secondary | ICD-10-CM | POA: Diagnosis not present

## 2018-11-09 DIAGNOSIS — Z0000 Encounter for general adult medical examination without abnormal findings: Secondary | ICD-10-CM | POA: Diagnosis not present

## 2018-11-09 DIAGNOSIS — Z125 Encounter for screening for malignant neoplasm of prostate: Secondary | ICD-10-CM | POA: Diagnosis not present

## 2018-11-09 DIAGNOSIS — R7309 Other abnormal glucose: Secondary | ICD-10-CM | POA: Diagnosis not present

## 2018-11-09 DIAGNOSIS — E7800 Pure hypercholesterolemia, unspecified: Secondary | ICD-10-CM | POA: Diagnosis not present

## 2018-11-09 DIAGNOSIS — Z6830 Body mass index (BMI) 30.0-30.9, adult: Secondary | ICD-10-CM | POA: Diagnosis not present

## 2019-02-25 DIAGNOSIS — Z20828 Contact with and (suspected) exposure to other viral communicable diseases: Secondary | ICD-10-CM | POA: Diagnosis not present

## 2019-11-01 DIAGNOSIS — D3121 Benign neoplasm of right retina: Secondary | ICD-10-CM | POA: Diagnosis not present

## 2019-11-01 DIAGNOSIS — H35363 Drusen (degenerative) of macula, bilateral: Secondary | ICD-10-CM | POA: Diagnosis not present

## 2020-01-08 DIAGNOSIS — F322 Major depressive disorder, single episode, severe without psychotic features: Secondary | ICD-10-CM | POA: Diagnosis not present

## 2020-01-08 DIAGNOSIS — I10 Essential (primary) hypertension: Secondary | ICD-10-CM | POA: Diagnosis not present

## 2020-01-08 DIAGNOSIS — E7800 Pure hypercholesterolemia, unspecified: Secondary | ICD-10-CM | POA: Diagnosis not present

## 2020-01-08 DIAGNOSIS — M2550 Pain in unspecified joint: Secondary | ICD-10-CM | POA: Diagnosis not present

## 2020-01-24 DIAGNOSIS — E7800 Pure hypercholesterolemia, unspecified: Secondary | ICD-10-CM | POA: Diagnosis not present

## 2021-01-13 DIAGNOSIS — R0981 Nasal congestion: Secondary | ICD-10-CM | POA: Diagnosis not present

## 2021-01-13 DIAGNOSIS — R051 Acute cough: Secondary | ICD-10-CM | POA: Diagnosis not present

## 2021-01-24 DIAGNOSIS — Z131 Encounter for screening for diabetes mellitus: Secondary | ICD-10-CM | POA: Diagnosis not present

## 2021-01-24 DIAGNOSIS — R7401 Elevation of levels of liver transaminase levels: Secondary | ICD-10-CM | POA: Diagnosis not present

## 2021-01-24 DIAGNOSIS — Z125 Encounter for screening for malignant neoplasm of prostate: Secondary | ICD-10-CM | POA: Diagnosis not present

## 2021-01-24 DIAGNOSIS — Z0000 Encounter for general adult medical examination without abnormal findings: Secondary | ICD-10-CM | POA: Diagnosis not present

## 2021-01-24 DIAGNOSIS — E7800 Pure hypercholesterolemia, unspecified: Secondary | ICD-10-CM | POA: Diagnosis not present

## 2021-01-24 DIAGNOSIS — I10 Essential (primary) hypertension: Secondary | ICD-10-CM | POA: Diagnosis not present

## 2021-11-11 DIAGNOSIS — M79644 Pain in right finger(s): Secondary | ICD-10-CM | POA: Diagnosis not present

## 2022-02-03 DIAGNOSIS — E7800 Pure hypercholesterolemia, unspecified: Secondary | ICD-10-CM | POA: Diagnosis not present

## 2022-02-03 DIAGNOSIS — F322 Major depressive disorder, single episode, severe without psychotic features: Secondary | ICD-10-CM | POA: Diagnosis not present

## 2022-02-03 DIAGNOSIS — Z0000 Encounter for general adult medical examination without abnormal findings: Secondary | ICD-10-CM | POA: Diagnosis not present

## 2022-02-03 DIAGNOSIS — I10 Essential (primary) hypertension: Secondary | ICD-10-CM | POA: Diagnosis not present

## 2022-02-03 DIAGNOSIS — Z125 Encounter for screening for malignant neoplasm of prostate: Secondary | ICD-10-CM | POA: Diagnosis not present

## 2022-02-03 DIAGNOSIS — R7309 Other abnormal glucose: Secondary | ICD-10-CM | POA: Diagnosis not present

## 2022-05-14 DIAGNOSIS — E7800 Pure hypercholesterolemia, unspecified: Secondary | ICD-10-CM | POA: Diagnosis not present

## 2022-07-23 DIAGNOSIS — K649 Unspecified hemorrhoids: Secondary | ICD-10-CM | POA: Diagnosis not present

## 2022-07-23 DIAGNOSIS — G4489 Other headache syndrome: Secondary | ICD-10-CM | POA: Diagnosis not present

## 2022-11-17 DIAGNOSIS — R140 Abdominal distension (gaseous): Secondary | ICD-10-CM | POA: Diagnosis not present

## 2022-11-17 DIAGNOSIS — F419 Anxiety disorder, unspecified: Secondary | ICD-10-CM | POA: Diagnosis not present

## 2022-11-17 DIAGNOSIS — R040 Epistaxis: Secondary | ICD-10-CM | POA: Diagnosis not present

## 2022-11-17 DIAGNOSIS — R350 Frequency of micturition: Secondary | ICD-10-CM | POA: Diagnosis not present

## 2022-11-25 ENCOUNTER — Emergency Department (HOSPITAL_COMMUNITY)
Admission: EM | Admit: 2022-11-25 | Discharge: 2022-11-25 | Disposition: A | Discharge status: Home / Self Care | Payer: BC Managed Care – PPO | Attending: Emergency Medicine | Admitting: Emergency Medicine

## 2022-11-25 ENCOUNTER — Other Ambulatory Visit: Payer: Self-pay

## 2022-11-25 ENCOUNTER — Emergency Department (HOSPITAL_COMMUNITY): Payer: BC Managed Care – PPO

## 2022-11-25 DIAGNOSIS — R209 Unspecified disturbances of skin sensation: Secondary | ICD-10-CM | POA: Diagnosis not present

## 2022-11-25 DIAGNOSIS — I10 Essential (primary) hypertension: Secondary | ICD-10-CM | POA: Diagnosis not present

## 2022-11-25 DIAGNOSIS — R519 Headache, unspecified: Secondary | ICD-10-CM | POA: Insufficient documentation

## 2022-11-25 DIAGNOSIS — M79602 Pain in left arm: Secondary | ICD-10-CM | POA: Insufficient documentation

## 2022-11-25 DIAGNOSIS — Z79899 Other long term (current) drug therapy: Secondary | ICD-10-CM | POA: Diagnosis not present

## 2022-11-25 LAB — CBC WITH DIFFERENTIAL/PLATELET
Abs Immature Granulocytes: 0.02 10*3/uL (ref 0.00–0.07)
Basophils Absolute: 0.1 10*3/uL (ref 0.0–0.1)
Basophils Relative: 1 %
Eosinophils Absolute: 0.3 10*3/uL (ref 0.0–0.5)
Eosinophils Relative: 4 %
HCT: 41.9 % (ref 39.0–52.0)
Hemoglobin: 14.3 g/dL (ref 13.0–17.0)
Immature Granulocytes: 0 %
Lymphocytes Relative: 29 %
Lymphs Abs: 2.1 10*3/uL (ref 0.7–4.0)
MCH: 31.2 pg (ref 26.0–34.0)
MCHC: 34.1 g/dL (ref 30.0–36.0)
MCV: 91.3 fL (ref 80.0–100.0)
Monocytes Absolute: 0.7 10*3/uL (ref 0.1–1.0)
Monocytes Relative: 9 %
Neutro Abs: 4.1 10*3/uL (ref 1.7–7.7)
Neutrophils Relative %: 57 %
Platelets: 261 10*3/uL (ref 150–400)
RBC: 4.59 MIL/uL (ref 4.22–5.81)
RDW: 12.4 % (ref 11.5–15.5)
WBC: 7.3 10*3/uL (ref 4.0–10.5)
nRBC: 0 % (ref 0.0–0.2)

## 2022-11-25 LAB — BASIC METABOLIC PANEL
Anion gap: 8 (ref 5–15)
BUN: 18 mg/dL (ref 6–20)
CO2: 24 mmol/L (ref 22–32)
Calcium: 9 mg/dL (ref 8.9–10.3)
Chloride: 103 mmol/L (ref 98–111)
Creatinine, Ser: 0.76 mg/dL (ref 0.61–1.24)
GFR, Estimated: >60 mL/min (ref >60)
Glucose, Bld: 99 mg/dL (ref 70–99)
Potassium: 3.6 mmol/L (ref 3.5–5.1)
Sodium: 135 mmol/L (ref 135–145)

## 2022-11-25 MED ORDER — AMLODIPINE BESYLATE 5 MG PO TABS
5.0000 mg | ORAL_TABLET | Freq: Once | ORAL | Status: AC
  Administered 2022-11-25: 5 mg via ORAL
  Filled 2022-11-25: qty 1

## 2022-11-25 MED ORDER — AMLODIPINE BESYLATE 5 MG PO TABS: 5.0000 mg | ORAL_TABLET | Freq: Every day | ORAL | 1 refills | Status: DC

## 2022-11-25 NOTE — ED Triage Notes (Signed)
Pt reports bp was 170/116 pta, c/o headache, left arm tingling and nose bleed earlier today.

## 2022-11-25 NOTE — ED Notes (Signed)
Urine and urine culture sent to main lab.

## 2022-11-25 NOTE — ED Provider Notes (Signed)
[place] EMERGENCY DEPARTMENT AT [facility] Provider Note   CSN: [id number] Arrival date & time: 11/25/22  1826     History  Chief Complaint  Patient presents with   Hypertension    Dave Hudson is a 57 y.o. male.  With PMH of HTN, HLD, no anticoagulation presents with concerns of headache associate with left arm paresthesias and blood pressure 170/116 prior to arrival.  Patient has not been on blood pressure medications for the past 15 years.  He was feeling unwell at work and had a mild posterior headache and pressure and was checking his blood pressure at home because he had 2 nosebleeds from the right naris earlier today.  It typically bleeds when blood pressure is elevated.  He has had no trauma and not on any blood thinners.  He is no longer dealing with any bleeding.  However when he checked up home it continue to increase on repeat checks until the highest he noted was 170/116.  He does have some pain in his left elbow rating down to his left hand as well as some paresthesias.  He has had no facial droop, no slurred speech, no focal weakness or loss of sensation.  He has no chest pain or shortness of breath.  He came here to be evaluated after being urged by both his wife and coworkers.   Hypertension       Home Medications Prior to Admission medications   Medication Sig Start Date End Date Taking? Authorizing Provider  acetaminophen (TYLENOL) 500 MG tablet Take 500 mg by mouth every 6 (six) hours as needed for moderate pain.    [provider]  atorvastatin (LIPITOR) 40 MG tablet TAKE 1 TABLET BY MOUTH ONCE DAILY AT 6PM 05/06/15   Nahser, Deloris Ping, MD  atorvastatin (LIPITOR) 40 MG tablet TAKE 1 TABLET BY MOUTH ONCE DAILY AT [facility] 05/14/15   Nahser, Deloris Ping, MD  citalopram (CELEXA) 20 MG tablet Take 20 mg by mouth daily.    [provider]  hydrochlorothiazide (HYDRODIURIL) 25 MG tablet TAKE 1 TABLET BY MOUTH EVERY DAY 03/18/15   Nahser, Deloris Ping, MD  hydrochlorothiazide (HYDRODIURIL) 25 MG tablet TAKE 1 TABLET BY MOUTH EVERY DAY 05/06/15   Nahser, Deloris Ping, MD  hydrochlorothiazide (HYDRODIURIL) 25 MG tablet TAKE 1 TABLET BY MOUTH EVERY DAY 05/14/15   Nahser, Deloris Ping, MD  KLOR-CON M10 10 MEQ tablet TAKE 1 TABLET (10 MEQ TOTAL) BY MOUTH DAILY. 03/18/15   Nahser, Deloris Ping, MD  KLOR-CON M10 10 MEQ tablet TAKE 1 TABLET BY MOUTH EVERY DAY 05/06/15   Nahser, Deloris Ping, MD  KLOR-CON M10 10 MEQ tablet TAKE 1 TABLET BY MOUTH EVERY DAY 05/14/15   Nahser, Deloris Ping, MD  Multiple Vitamin (MULTIVITAMIN WITH MINERALS) TABS tablet Take 1 tablet by mouth daily.    [provider]  NON FORMULARY Take 2 capsules by mouth daily. Used for memory.    [provider]  NON FORMULARY Take 2 capsules by mouth daily. Testerone booster    [provider]  Omega-3 Fatty Acids (FISH OIL) 1200 MG CAPS Take 1 capsule by mouth daily.    [provider]      Allergies    Patient has no known allergies.    Review of Systems   Review of Systems  Physical Exam Updated Vital Signs BP (!) 156/99 (BP Location: Right Arm)   Pulse (!) 57   Temp 98.3 F (36.8 C) (Oral)  Resp 18   Ht 5\' 10"  (1.778 m)   Wt 97 kg   SpO2 95%   BMI 30.68 kg/m  Physical Exam  ED Results / Procedures / Treatments   Labs (all labs ordered are listed, but only abnormal results are displayed) Labs Reviewed - No data to display  EKG None  Radiology No results found.  Procedures Procedures    Medications Ordered in ED Medications - No data to display  ED Course/ Medical Decision Making/ A&P                             Medical Decision Making Amount and/or Complexity of Data Reviewed Labs: ordered. Radiology: ordered.      Final Clinical Impression(s) / ED Diagnoses Final diagnoses:  None    Rx / DC Orders ED Discharge Orders     None

## 2022-11-25 NOTE — Discharge Instructions (Addendum)
You were seen in the emergency department today for elevated blood pressure.  Your exam/workup today does not appear to show any abnormalities that would require admission.  Please follow up with your doctor in the next week to discuss your elevated blood pressures and your current medication regimen.  I have started you on a blood pressure medication called amlodipine.  Return to the emergency department if you develop any sudden/severe headache, confusion, slurred speech, worsening vision, weakness or numbness of any arm or leg, or for any chest pain, pressure, or trouble breathing.

## 2022-11-26 DIAGNOSIS — R3129 Other microscopic hematuria: Secondary | ICD-10-CM | POA: Diagnosis not present

## 2022-12-02 DIAGNOSIS — I10 Essential (primary) hypertension: Secondary | ICD-10-CM | POA: Diagnosis not present

## 2022-12-02 DIAGNOSIS — F419 Anxiety disorder, unspecified: Secondary | ICD-10-CM | POA: Diagnosis not present

## 2022-12-02 DIAGNOSIS — R140 Abdominal distension (gaseous): Secondary | ICD-10-CM | POA: Diagnosis not present

## 2023-02-11 DIAGNOSIS — M25562 Pain in left knee: Secondary | ICD-10-CM | POA: Diagnosis not present

## 2023-02-11 DIAGNOSIS — E7800 Pure hypercholesterolemia, unspecified: Secondary | ICD-10-CM | POA: Diagnosis not present

## 2023-02-11 DIAGNOSIS — M2550 Pain in unspecified joint: Secondary | ICD-10-CM | POA: Diagnosis not present

## 2023-02-11 DIAGNOSIS — Z23 Encounter for immunization: Secondary | ICD-10-CM | POA: Diagnosis not present

## 2023-02-11 DIAGNOSIS — R200 Anesthesia of skin: Secondary | ICD-10-CM | POA: Diagnosis not present

## 2023-02-11 DIAGNOSIS — R7303 Prediabetes: Secondary | ICD-10-CM | POA: Diagnosis not present

## 2023-02-11 DIAGNOSIS — Z0000 Encounter for general adult medical examination without abnormal findings: Secondary | ICD-10-CM | POA: Diagnosis not present

## 2023-02-11 DIAGNOSIS — R202 Paresthesia of skin: Secondary | ICD-10-CM | POA: Diagnosis not present

## 2023-02-11 DIAGNOSIS — Z125 Encounter for screening for malignant neoplasm of prostate: Secondary | ICD-10-CM | POA: Diagnosis not present

## 2023-08-12 DIAGNOSIS — R7303 Prediabetes: Secondary | ICD-10-CM | POA: Diagnosis not present

## 2023-11-05 ENCOUNTER — Other Ambulatory Visit: Payer: Self-pay

## 2023-11-05 ENCOUNTER — Emergency Department (HOSPITAL_COMMUNITY)
Admission: EM | Admit: 2023-11-05 | Discharge: 2023-11-05 | Disposition: A | Discharge status: Home / Self Care | Attending: Emergency Medicine | Admitting: Emergency Medicine

## 2023-11-05 ENCOUNTER — Emergency Department (HOSPITAL_COMMUNITY)

## 2023-11-05 ENCOUNTER — Encounter (HOSPITAL_COMMUNITY): Payer: Self-pay | Admitting: *Deleted

## 2023-11-05 DIAGNOSIS — R531 Weakness: Secondary | ICD-10-CM | POA: Diagnosis not present

## 2023-11-05 DIAGNOSIS — E860 Dehydration: Secondary | ICD-10-CM | POA: Insufficient documentation

## 2023-11-05 DIAGNOSIS — I10 Essential (primary) hypertension: Secondary | ICD-10-CM | POA: Diagnosis not present

## 2023-11-05 DIAGNOSIS — R197 Diarrhea, unspecified: Secondary | ICD-10-CM | POA: Diagnosis not present

## 2023-11-05 DIAGNOSIS — N179 Acute kidney failure, unspecified: Secondary | ICD-10-CM | POA: Diagnosis not present

## 2023-11-05 DIAGNOSIS — R110 Nausea: Secondary | ICD-10-CM | POA: Diagnosis not present

## 2023-11-05 DIAGNOSIS — R45851 Suicidal ideations: Secondary | ICD-10-CM | POA: Diagnosis not present

## 2023-11-05 DIAGNOSIS — R918 Other nonspecific abnormal finding of lung field: Secondary | ICD-10-CM | POA: Diagnosis not present

## 2023-11-05 DIAGNOSIS — F411 Generalized anxiety disorder: Secondary | ICD-10-CM | POA: Diagnosis present

## 2023-11-05 DIAGNOSIS — R1084 Generalized abdominal pain: Secondary | ICD-10-CM | POA: Diagnosis not present

## 2023-11-05 DIAGNOSIS — R109 Unspecified abdominal pain: Secondary | ICD-10-CM | POA: Diagnosis not present

## 2023-11-05 DIAGNOSIS — Z79899 Other long term (current) drug therapy: Secondary | ICD-10-CM | POA: Insufficient documentation

## 2023-11-05 HISTORY — DX: Essential (primary) hypertension: I10

## 2023-11-05 LAB — COMPREHENSIVE METABOLIC PANEL WITH GFR
ALT: 44 U/L (ref 0–44)
AST: 37 U/L (ref 15–41)
Albumin: 4.5 g/dL (ref 3.5–5.0)
Alkaline Phosphatase: 50 U/L (ref 38–126)
Anion gap: 18 — ABNORMAL HIGH (ref 5–15)
BUN: 16 mg/dL (ref 6–20)
CO2: 17 mmol/L — ABNORMAL LOW (ref 22–32)
Calcium: 10 mg/dL (ref 8.9–10.3)
Chloride: 101 mmol/L (ref 98–111)
Creatinine, Ser: 0.96 mg/dL (ref 0.61–1.24)
GFR, Estimated: >60 mL/min (ref >60)
Glucose, Bld: 103 mg/dL — ABNORMAL HIGH (ref 70–99)
Potassium: 3.9 mmol/L (ref 3.5–5.1)
Sodium: 136 mmol/L (ref 135–145)
Total Bilirubin: 2.3 mg/dL — ABNORMAL HIGH (ref 0.0–1.2)
Total Protein: 7.9 g/dL (ref 6.5–8.1)

## 2023-11-05 LAB — URINALYSIS, ROUTINE W REFLEX MICROSCOPIC
Bilirubin Urine: NEGATIVE
Glucose, UA: NEGATIVE mg/dL
Hgb urine dipstick: NEGATIVE
Ketones, ur: 80 mg/dL — AB
Leukocytes,Ua: NEGATIVE
Nitrite: NEGATIVE
Protein, ur: NEGATIVE mg/dL
Specific Gravity, Urine: >1.046 — ABNORMAL HIGH (ref 1.005–1.030)
pH: 6 (ref 5.0–8.0)

## 2023-11-05 LAB — RAPID URINE DRUG SCREEN, HOSP PERFORMED
Amphetamines: NOT DETECTED
Barbiturates: NOT DETECTED
Benzodiazepines: NOT DETECTED
Cocaine: NOT DETECTED
Opiates: NOT DETECTED
Tetrahydrocannabinol: POSITIVE — AB

## 2023-11-05 LAB — CBC
HCT: 45.9 % (ref 39.0–52.0)
Hemoglobin: 16.3 g/dL (ref 13.0–17.0)
MCH: 31.7 pg (ref 26.0–34.0)
MCHC: 35.5 g/dL (ref 30.0–36.0)
MCV: 89.3 fL (ref 80.0–100.0)
Platelets: 303 10*3/uL (ref 150–400)
RBC: 5.14 MIL/uL (ref 4.22–5.81)
RDW: 12.2 % (ref 11.5–15.5)
WBC: 11.1 10*3/uL — ABNORMAL HIGH (ref 4.0–10.5)
nRBC: 0 % (ref 0.0–0.2)

## 2023-11-05 LAB — TROPONIN I (HIGH SENSITIVITY): Troponin I (High Sensitivity): 9 ng/L (ref <18)

## 2023-11-05 LAB — LIPASE, BLOOD: Lipase: 37 U/L (ref 11–51)

## 2023-11-05 MED ORDER — SODIUM CHLORIDE 0.9 % IV BOLUS
1000.0000 mL | Freq: Once | INTRAVENOUS | Status: AC
  Administered 2023-11-05: 1000 mL via INTRAVENOUS

## 2023-11-05 MED ORDER — ONDANSETRON 4 MG PO TBDP: 4.0000 mg | ORAL_TABLET | Freq: Three times a day (TID) | ORAL | 0 refills | Status: DC | PRN

## 2023-11-05 MED ORDER — IOHEXOL 350 MG/ML SOLN
75.0000 mL | Freq: Once | INTRAVENOUS | Status: AC | PRN
  Administered 2023-11-05: 75 mL via INTRAVENOUS

## 2023-11-05 MED ORDER — ONDANSETRON HCL 4 MG/2ML IJ SOLN
4.0000 mg | Freq: Once | INTRAMUSCULAR | Status: AC
Start: 2023-11-05 — End: 2023-11-05
  Administered 2023-11-05: 4 mg via INTRAVENOUS
  Filled 2023-11-05: qty 2

## 2023-11-05 NOTE — ED Notes (Signed)
 ED Provider at bedside.

## 2023-11-05 NOTE — Consult Note (Addendum)
 [facility] Psychiatric Consult Initial  Patient Name: .Dave Hudson  MRN: [id number]  DOB: 06-30-1965  Consult Order details:  Orders (From admission, onward)     Start     Ordered   11/05/23 1313  CONSULT TO CALL ACT TEAM       Ordering Provider: Sherra Dk, PA-C  Provider:  (Not yet assigned)  Question:  Reason for Consult?  Answer:  SI   11/05/23 1312             Mode of Visit: In person    Psychiatry Consult Evaluation  Service Date: Nov 05, 2023 LOS:  LOS: 0 days  Chief Complaint: "I feel such a relief now that me and my wife talked"  Primary Psychiatric Diagnoses  GAD  Assessment   Dave Hudson is a 58 y.o. Caucasian male with a past psychiatric history of generalized anxiety disorder, and pertinent medical comorbidities/history that include hypertension and hyperlipidemia, who presented this encounter by way of EMS from work, due to experiencing nausea, vomiting, abdominal pain, and other physical symptoms, who after evaluation by EDP team, consulted psychiatry for further investigation into endorsements of experiencing suicidal ideations and psychosocial stress.  Upon evaluation, patient presents with symptomology that is most consistent with the patient's chronic illness course of generalized anxiety disorder.  Evidence of this is appreciable from the patient's somatic complaints that he has been experiencing, endorsements of struggles with various anxiety symptomology, as well as the patient's endorsements of having thoughts last night briefly of wanting to hurt himself.  From investigation, there is no evidence that the patient is an imminent risk to himself or others, and given that the patient has strong supports in place, of which include endorse no safety concerns for the patient, with close outpatient follow-ups, and strict ability to adhere to safety plan that was created today in a meeting with him and his wife, recommendation is for psychiatric  clearance, as well as additional recommendations listed below.  Spoke with Dr. Goli who is in agreement with plan for psychiatric clearance, as well as additional recommendations listed below.  Diagnoses:  Active Hospital problems: Principal Problem:   GAD (generalized anxiety disorder)    Plan   #GAD  ## Psychiatric Recommendations:   - Recommend continue Celexa from 10 mg p.o. daily - Recommend close outpatient follow-up with the patient's primary care provider and medication management provider - Recommend strict adherence to safety plan created today with his wife  Safety Plan [name] will reach out to Wife, call 911 or call mobile crisis, or go to nearest emergency room if condition worsens or if suicidal thoughts become active Patients' will follow up with PCP for outpatient psychiatric services (medication management).  The suicide prevention education provided includes the following: Suicide risk factors Suicide prevention and interventions National Suicide Hotline telephone number [facility] assessment telephone number [facility] Emergency Assistance 911 [facility] and/or Residential Mobile Crisis Unit telephone number Request made of family/significant other to:  Wife Remove weapons (e.g., guns, rifles, knives), all items previously/currently identified as safety concern.   Remove drugs/medications (over the counter, prescriptions, illicit drugs), all items previously/currently identified as a safety concern.   ## Medical Decision Making Capacity: Not specifically addressed in this encounter  ## Further Work-up: None at this time  ## Disposition:-- There are no psychiatric contraindications to discharge at this time  ## Behavioral / Environmental: -Routine safety/agitation precautions until discharge; safety plan upon discharge    ##  Safety and Observation Level:  - Based on my clinical evaluation, I estimate the patient to be at low  risk of self harm in the current setting and upon recommendation for discharge. - At this time, we recommend  routine. This decision is based on my review of the chart including patient's history and current presentation, interview of the patient, mental status examination, and consideration of suicide risk including evaluating suicidal ideation, plan, intent, suicidal or self-harm behaviors, risk factors, and protective factors. This judgment is based on our ability to directly address suicide risk, implement suicide prevention strategies, and develop a safety plan while the patient is in the clinical setting. Please contact our team if there is a concern that risk level has changed.  CSSR Risk Category:C-SSRS RISK CATEGORY: Low Risk  Suicide Risk Assessment: Patient has following modifiable risk factors for suicide: None, which we are addressing by treatment recommendations. Patient has following non-modifiable or demographic risk factors for suicide: male gender and separation or divorce Patient has the following protective factors against suicide: Access to outpatient mental health care, Supportive family, Supportive friends, Cultural, spiritual, or religious beliefs that discourage suicide, Pets in the home, Frustration tolerance, no history of suicide attempts, and no history of NSSIB  Thank you for this consult request. Recommendations have been communicated to the primary team.  We will sign off at this time.   Volanda Gruber, NP       History of Present Illness   Dave Hudson is a 58 y.o. Caucasian male with a past psychiatric history of generalized anxiety disorder, and pertinent medical comorbidities/history that include hypertension and hyperlipidemia, who presented this encounter by way of EMS from work, due to experiencing nausea, vomiting, abdominal pain, and other physical symptoms, who after evaluation by EDP team, consulted psychiatry for further investigation into endorsements  of experiencing suicidal ideations and psychosocial stress.  Patient seen today at the [facility] emergency department for face-to-face psychiatric evaluation.  Upon evaluation, patient endorses that he was brought in by way of EMS from work, due to having an episode of nausea, vomiting, abdominal pain, feeling lightheaded, numbness in his extremities, weakness, and shortness of breath, so for safety, states that he advised his coworkers to have him brought in.  Patient reports that after he was medically cleared, states that he was told that psychiatry would come see him, given his endorsements he states that were "a little misunderstood, but that is alright" in regards to having a brief moment last night of having thoughts of harming himself, that he states he told the triage nurse and EDP.   Expanding on recent thoughts of briefly wanting to harm himself, patient endorses that last night he was not feeling well after he states possibly having food poisoning, and or from an exacerbation of his physical symptoms he has been experiencing over the last two years, and because he had to call out of work, and he has already been having troubles with his anxiety, and family finances he has been hiding from his wife, states that he told the triage nurse/edp that, "I told that provider and nurse who first took me in when I got in, that I briefly had the ridiculous idea of wanting to hurt myself, but then I quickly was like, what are you thinking, and it went away, I have kids, I have my wife, I felt stupid right away."  Expanding on psychosocial stressors, to start, patient endorses that 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  Additionally, patient endorses that around the same time that he began experiencing increased psychosocial stress around hiding things from his wife, states that he began to have an increase in physical symptoms such as the ones that he reported got him brought in today, and states that, "now that I think about it, I honestly think that all of this has been related to my anxiety and stress about hiding everything from my wife, because I have gone through every test there is, and I keep getting a clean bill of health, I honestly think I just needed to come clean about things, because I feel great now that me and my wife have talked".  Expanding on conversation he endorses that he had with his wife, patient endorses that because he was brought in today, and his wife had to meet him here at the hospital, states that he finally "just told her everything" and that she was very receptive, and immediately he began to feel happy and severely relieved; states that his mood currently is very happy, presents with a congruent affect, good eye contact, and congruent interpersonal style.  Patient endorses that he is not suicidal, denies homicidal ideations, and denies any current depressive and/or anxious symptomology.  Patient endorses that prior to coming in, and notably telling his wife about everything, has been having some "anxious nights" of not sleeping the best, and has had problems over the last 2 years of eating and appetite, worrying about a variety of things, trouble relaxing, but has been functional and going to work, and participating with family, and things in his daily life.  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  UDS appreciably positive for cannabinoids, BAL unremarkable.  Patient endorses no history of suicide attempts and/or self-injurious behavior.  Patient endorses no formal outpatient psychiatry medication management, but when his business started to fail in 2023, started taking Celexa through his primary care provider, of which he has restarted and been on over the last year at 10 mg/day (tried 20mg  at one point abruptly and didn't tolerate).  Patient endorses brief therapy use through employee health at a previous company of his, also due to his side business failing, no history of more recent therapy use.  Patient orientation is intact, no concerns for fluctuations in consciousness.  Patient denies any auditory and or visual hallucinations, and objectively, does not appear to be presenting with psychotic features.  Patient endorses no history of inpatient mental health hospitalizations.  Collateral, patient's wife, spoken to at bedside, once away from the patient, and again for safety planning meeting with both patient and his wife  Patient's wife endorses no safe  concerns with the patient returning home, states that largely the patient's account of things is true, and also endorses that they did have a conversation, and states that they are doing better.  Discussed extensively recommendations to move forward, including strict adherence to safety plan for safe discharge, to which patient and his wife verbalized understanding.  Review of Systems  Psychiatric/Behavioral:  Negative for depression, hallucinations, substance abuse and suicidal ideas. The patient is not nervous/anxious and does not have insomnia.   All other systems reviewed and are negative.   Psychiatric and Social History   Psychiatric History:  Information collected from chart review/patient  Prev Dx/Sx: GAD Current Psych Provider: PCP Home Meds (current): Celexa 10 mg p.o. daily Previous Med Trials: Historically has tried Celexa 20 mg p.o. daily, but after not taking his medication for a meaningful period of time, abruptly started 20 mg and did not do well, so recently went back to 10 mg Therapy: None in recent years, but when his side business first went under, states that he did a few sessions with his employee health therapist  Prior Psych Hospitalization: None Prior Self Harm: None Prior Violence: None  Family Psych History: None Family Hx suicide: None  Social History:  Developmental Hx: Normal Educational Hx: Academic librarian Hx: Works for [employer] Hx: None Living Situation: Lives with wife Spiritual Hx: None  Access to weapons/lethal means: None reported  Substance History Alcohol: 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, currently, states he utilizes about 6 beers per week Type of alcohol: Beer Last Drink: " About a week ago" Number of drinks per day: " About one now, if that" History of alcohol withdrawal seizures: None History of DT's : None Tobacco: Yes, took up vaping about a year ago to help with drinking EtOH Illicit drugs: None Prescription drug abuse: None Rehab hx: None  Exam Findings  Physical Exam: As below Vital Signs:  Temp:  [98.5 F (36.9 C)] 98.5 F (36.9 C) (05/30 1009) Pulse Rate:  [60] 60 (05/30 1009) Resp:  [22] 22 (05/30 1009) BP: (149)/(94) 149/94 (05/30 1009) SpO2:  [100 %] 100 % (05/30 1009) Weight:  [88.9 kg] 88.9 kg (05/30 1010) Blood pressure (!) 149/94, pulse 60, temperature 98.5 F (36.9 C), temperature source Oral, resp. rate (!) 22, height 5\' 10"  (1.778 m), weight 88.9 kg, SpO2 100%. Body mass index is 28.12 kg/m.  Physical Exam Vitals and nursing note reviewed.  Constitutional:       General: He is not in acute distress.    Appearance: He is normal weight. He is not ill-appearing, toxic-appearing or diaphoretic.  Pulmonary:     Effort: Pulmonary effort is normal.  Skin:    General: Skin is warm and dry.  Neurological:     Mental Status: He is alert and oriented to person, place, and time.  Psychiatric:        Attention and Perception: Attention and perception normal.        Mood and Affect: Mood and affect normal.        Speech: Speech normal.        Behavior: Behavior is cooperative.        Thought Content: Thought content normal. Thought content is not paranoid or delusional. Thought content does not include homicidal or suicidal ideation.        Cognition and Memory: Cognition and memory normal.        Judgment: Judgment normal.  Mental Status Exam: General Appearance: Casual  Orientation:  Full (Time, Place, and Person)  Memory:  WDL  Concentration:  Concentration: Good and Attention Span: Good  Recall:  Good  Attention  Good  Eye Contact:  Good  Speech:  Clear and Coherent and Normal Rate  Language:  Good  Volume:  Normal  Mood: "I feel almost euphoric now that me and my wife talked about things"   Affect:  Happy  Thought Process:  Coherent, Goal Directed, and Linear  Thought Content:  Logical  Suicidal Thoughts:  No  Homicidal Thoughts:  No  Judgement:  Intact  Insight:  Good  Psychomotor Activity:  Normal  Akathisia:  No  Fund of Knowledge:  Good      Assets:  Communication Skills Desire for Improvement Financial Resources/Insurance Housing Intimacy Leisure Time Physical Health Resilience Social Support Talents/Skills Transportation Vocational/Educational  Cognition:  WNL  ADL's:  Intact  AIMS (if indicated):   0     Other History   These have been pulled in through the EMR, reviewed, and updated if appropriate.  Family History:  The patient's family history is not on file.  Medical History: Past Medical History:  Diagnosis  Date   Hypertension     Surgical History: History reviewed. No pertinent surgical history.   Medications:  No current facility-administered medications for this encounter.  Current Outpatient Medications:    atorvastatin  (LIPITOR) 40 MG tablet, TAKE 1 TABLET BY MOUTH ONCE DAILY AT 6PM (Patient taking differently: Take 40 mg by mouth daily.), Disp: 14 tablet, Rfl: 0   citalopram (CELEXA) 10 MG tablet, Take 10 mg by mouth daily., Disp: , Rfl:    Multiple Vitamins-Minerals (MULTIVITAMIN MEN) TABS, Take 1 tablet by mouth daily., Disp: , Rfl:    Omega-3 Fatty Acids (FISH OIL PO), Take 1 capsule by mouth daily., Disp: , Rfl:    ondansetron (ZOFRAN-ODT) 4 MG disintegrating tablet, Take 1 tablet (4 mg total) by mouth every 8 (eight) hours as needed., Disp: 20 tablet, Rfl: 0  Allergies: No Known Allergies  Volanda Gruber, NP

## 2023-11-05 NOTE — ED Provider Notes (Signed)
 [facility] AT [facility] Provider Note   CSN: [id number] Arrival date & time: 11/05/23  6578     History  Chief Complaint  Patient presents with   Emesis   Nausea   Suicidal    Dave Hudson is a 58 y.o. male.  Patient with history of hypertension, hyperlipidemia presents today with multiple complaints. He states that on Wednesday and Thursday he was having abdominal pain, nausea, vomiting and diarrhea. No hematemesis, hematochezia, or melena. Pain was generalized throughout his abdomen. He called out of work Thursday and slept most of the day. Today he got up feeling some better, went to work and began to feel lightheadedness, numbness in his extremities, weakness, and shortness of breath. Workers at his job checked his blood pressure and noted it was elevated at 180 systolic and called EMS for transport here. Patient also notes urinary frequency without dysuria. He states he has been under increasing stress recently and has been drinking more alcohol daily and has also picked up vaping in the past year. He notes that for the past 2 months he has not had an appetite and has lost 18 lbs. He is concerned he has colon cancer. He had a normal colonoscopy 7 years ago. Denies any hematochezia or melena. Continues to have some abdominal pain with nausea, no longer vomiting. Also notes that he has been stressed to the point that this morning he considered committing suicide. He denies any plan for this. Reports "I have been feeling like I was going to suddenly die or something bad was going to happen for several months now, I feel like Im going to die today." Patient extremely anxious on exam. Denies headaches, fevers, chest pain, leg pain, leg swelling, recent travel, or recent surgeries.  The history is provided by the patient. No language interpreter was used.  Emesis Associated symptoms: abdominal pain        Home Medications Prior to Admission medications    Medication Sig Start Date End Date Taking? Authorizing Provider  acetaminophen (TYLENOL) 500 MG tablet Take 500 mg by mouth every 6 (six) hours as needed for moderate pain.    [provider]  amLODipine  (NORVASC ) 5 MG tablet Take 1 tablet (5 mg total) by mouth daily. 11/25/22 01/24/23  Arnie Bibber, MD  atorvastatin  (LIPITOR) 40 MG tablet TAKE 1 TABLET BY MOUTH ONCE DAILY AT [facility] 05/06/15   Nahser, Lela Purple, MD  atorvastatin  (LIPITOR) 40 MG tablet TAKE 1 TABLET BY MOUTH ONCE DAILY AT [facility] 05/14/15   Nahser, Lela Purple, MD  citalopram (CELEXA) 20 MG tablet Take 20 mg by mouth daily.    [provider]  hydrochlorothiazide  (HYDRODIURIL ) 25 MG tablet TAKE 1 TABLET BY MOUTH EVERY DAY 03/18/15   Nahser, Lela Purple, MD  hydrochlorothiazide  (HYDRODIURIL ) 25 MG tablet TAKE 1 TABLET BY MOUTH EVERY DAY 05/06/15   Nahser, Lela Purple, MD  hydrochlorothiazide  (HYDRODIURIL ) 25 MG tablet TAKE 1 TABLET BY MOUTH EVERY DAY 05/14/15   Nahser, Lela Purple, MD  KLOR-CON  M10 10 MEQ tablet TAKE 1 TABLET (10 MEQ TOTAL) BY MOUTH DAILY. 03/18/15   Nahser, Lela Purple, MD  KLOR-CON  M10 10 MEQ tablet TAKE 1 TABLET BY MOUTH EVERY DAY 05/06/15   Nahser, Lela Purple, MD  KLOR-CON  M10 10 MEQ tablet TAKE 1 TABLET BY MOUTH EVERY DAY 05/14/15   Nahser, Lela Purple, MD  Multiple Vitamin (MULTIVITAMIN WITH MINERALS) TABS tablet Take 1 tablet by mouth daily.    [provider]  NON FORMULARY Take 2 capsules by mouth daily. Used for memory.    [provider]  NON FORMULARY Take 2 capsules by mouth daily. Testerone booster    [provider]  Omega-3 Fatty Acids (FISH OIL) 1200 MG CAPS Take 1 capsule by mouth daily.    [provider]      Allergies    Patient has no known allergies.    Review of Systems   Review of Systems  Gastrointestinal:  Positive for abdominal pain and nausea.  All other systems reviewed and are negative.   Physical Exam Updated Vital Signs BP (!) 149/94 (BP  Location: Right Arm)   Pulse 60   Temp 98.5 F (36.9 C) (Oral)   Resp (!) 22   Ht 5\' 10"  (1.778 m)   Wt 88.9 kg   SpO2 100%   BMI 28.12 kg/m  Physical Exam Vitals and nursing note reviewed.  Constitutional:      General: He is not in acute distress.    Appearance: Normal appearance. He is normal weight. He is not ill-appearing, toxic-appearing or diaphoretic.     Comments: Anxious appearing  HENT:     Head: Normocephalic and atraumatic.  Eyes:     Extraocular Movements: Extraocular movements intact.     Pupils: Pupils are equal, round, and reactive to light.  Cardiovascular:     Rate and Rhythm: Normal rate and regular rhythm.     Heart sounds: Normal heart sounds.  Pulmonary:     Effort: Pulmonary effort is normal. No respiratory distress.     Breath sounds: Normal breath sounds.  Abdominal:     General: Abdomen is flat.     Palpations: Abdomen is soft.     Tenderness: There is generalized abdominal tenderness. There is no guarding or rebound. Negative signs include Murphy's sign.  Musculoskeletal:        General: Normal range of motion.     Cervical back: Normal range of motion.     Right lower leg: No edema.     Left lower leg: No edema.  Skin:    General: Skin is warm and dry.  Neurological:     General: No focal deficit present.     Mental Status: He is alert and oriented to person, place, and time.     Cranial Nerves: No cranial nerve deficit.     ED Results / Procedures / Treatments   Labs (all labs ordered are listed, but only abnormal results are displayed) Labs Reviewed  COMPREHENSIVE METABOLIC PANEL WITH GFR - Abnormal; Notable for the following components:      Result Value   CO2 17 (*)    Glucose, Bld 103 (*)    Total Bilirubin 2.3 (*)    Anion gap 18 (*)    All other components within normal limits  CBC - Abnormal; Notable for the following components:   WBC 11.1 (*)    All other components within normal limits  URINALYSIS, ROUTINE W REFLEX  MICROSCOPIC - Abnormal; Notable for the following components:   Specific Gravity, Urine >1.046 (*)    Ketones, ur 80 (*)    All other components within normal limits  LIPASE, BLOOD  RAPID URINE DRUG SCREEN, HOSP PERFORMED  TROPONIN I (HIGH SENSITIVITY)  TROPONIN I (HIGH SENSITIVITY)    EKG None  Radiology CT Angio Chest PE W and/or Wo Contrast Result Date: 11/05/2023 CLINICAL DATA:  Unintended weight loss. Nausea and vomiting with abdominal pain and  cramps. Generalized weakness. Clinical concern for pulmonary embolus. EXAM: CT ANGIOGRAPHY CHEST CT ABDOMEN AND PELVIS WITH CONTRAST TECHNIQUE: Multidetector CT imaging of the chest was performed using the standard protocol during bolus administration of intravenous contrast. Multiplanar CT image reconstructions and MIPs were obtained to evaluate the vascular anatomy. Multidetector CT imaging of the abdomen and pelvis was performed using the standard protocol during bolus administration of intravenous contrast. RADIATION DOSE REDUCTION: This exam was performed according to the departmental dose-optimization program which includes automated exposure control, adjustment of the mA and/or kV according to patient size and/or use of iterative reconstruction technique. CONTRAST:  75mL OMNIPAQUE IOHEXOL 350 MG/ML SOLN COMPARISON:  None Available. FINDINGS: CTA CHEST FINDINGS Cardiovascular: The heart size is normal. No substantial pericardial effusion. Ascending thoracic aorta measures up to 4.3 cm diameter without evidence for thoracic aortic dissection. No CT evidence for acute pulmonary embolus. Mediastinum/Nodes: No mediastinal lymphadenopathy. There is no hilar lymphadenopathy. The esophagus has normal imaging features. There is no axillary lymphadenopathy. Lungs/Pleura: 3 mm posterior left lower lobe pulmonary nodule identified on image 97/4. No suspicious pulmonary nodule or mass. No focal airspace consolidation. No pleural effusion. Musculoskeletal: No  worrisome lytic or sclerotic osseous abnormality. Review of the MIP images confirms the above findings. CT ABDOMEN and PELVIS FINDINGS Hepatobiliary: No suspicious focal abnormality within the liver parenchyma. Tiny cyst noted in the caudate lobe. No followup imaging is recommended. There is no evidence for gallstones, gallbladder wall thickening, or pericholecystic fluid. No intrahepatic or extrahepatic biliary dilation. Pancreas: No focal mass lesion. No dilatation of the main duct. No intraparenchymal cyst. No peripancreatic edema. Spleen: No splenomegaly. No suspicious focal mass lesion. Adrenals/Urinary Tract: No adrenal nodule or mass. Kidneys unremarkable. No evidence for hydroureter. The urinary bladder appears normal for the degree of distention. Stomach/Bowel: Stomach is unremarkable. No gastric wall thickening. No evidence of outlet obstruction. Duodenum is normally positioned as is the ligament of Treitz. No small bowel wall thickening. No small bowel dilatation. The terminal ileum is normal. The appendix is normal. No gross colonic mass. No colonic wall thickening. Fluid is visible in the left colon and rectum, a finding that can be seen in the setting of clinical diarrhea. Vascular/Lymphatic: There is mild atherosclerotic calcification of the abdominal aorta without aneurysm. There is no gastrohepatic or hepatoduodenal ligament lymphadenopathy. No retroperitoneal or mesenteric lymphadenopathy. No pelvic sidewall lymphadenopathy. Reproductive: The prostate gland and seminal vesicles are unremarkable. Other: No intraperitoneal free fluid. Musculoskeletal: No worrisome lytic or sclerotic osseous abnormality. Tiny umbilical hernia contains only fat. Review of the MIP images confirms the above findings. IMPRESSION: 1. No CT evidence for acute pulmonary embolus. No acute findings in the chest. 2. 3 mm posterior left lower lobe pulmonary nodule. No follow-up needed if patient is low-risk.This recommendation  follows the consensus statement: Guidelines for Management of Incidental Pulmonary Nodules Detected on CT Images: From the Fleischner Society 2017; Radiology 2017; 284:228-243. 3. Fluid is visible in the left colon and rectum, a finding that can be seen in the setting of clinical diarrhea. 4.  Aortic Atherosclerosis (ICD10-I70.0). Electronically Signed   By: Donnal Fusi M.D.   On: 11/05/2023 12:45   CT ABDOMEN PELVIS W CONTRAST Result Date: 11/05/2023 CLINICAL DATA:  Unintended weight loss. Nausea and vomiting with abdominal pain and cramps. Generalized weakness. Clinical concern for pulmonary embolus. EXAM: CT ANGIOGRAPHY CHEST CT ABDOMEN AND PELVIS WITH CONTRAST TECHNIQUE: Multidetector CT imaging of the chest was performed using the standard protocol during bolus administration of intravenous  contrast. Multiplanar CT image reconstructions and MIPs were obtained to evaluate the vascular anatomy. Multidetector CT imaging of the abdomen and pelvis was performed using the standard protocol during bolus administration of intravenous contrast. RADIATION DOSE REDUCTION: This exam was performed according to the departmental dose-optimization program which includes automated exposure control, adjustment of the mA and/or kV according to patient size and/or use of iterative reconstruction technique. CONTRAST:  75mL OMNIPAQUE IOHEXOL 350 MG/ML SOLN COMPARISON:  None Available. FINDINGS: CTA CHEST FINDINGS Cardiovascular: The heart size is normal. No substantial pericardial effusion. Ascending thoracic aorta measures up to 4.3 cm diameter without evidence for thoracic aortic dissection. No CT evidence for acute pulmonary embolus. Mediastinum/Nodes: No mediastinal lymphadenopathy. There is no hilar lymphadenopathy. The esophagus has normal imaging features. There is no axillary lymphadenopathy. Lungs/Pleura: 3 mm posterior left lower lobe pulmonary nodule identified on image 97/4. No suspicious pulmonary nodule or mass.  No focal airspace consolidation. No pleural effusion. Musculoskeletal: No worrisome lytic or sclerotic osseous abnormality. Review of the MIP images confirms the above findings. CT ABDOMEN and PELVIS FINDINGS Hepatobiliary: No suspicious focal abnormality within the liver parenchyma. Tiny cyst noted in the caudate lobe. No followup imaging is recommended. There is no evidence for gallstones, gallbladder wall thickening, or pericholecystic fluid. No intrahepatic or extrahepatic biliary dilation. Pancreas: No focal mass lesion. No dilatation of the main duct. No intraparenchymal cyst. No peripancreatic edema. Spleen: No splenomegaly. No suspicious focal mass lesion. Adrenals/Urinary Tract: No adrenal nodule or mass. Kidneys unremarkable. No evidence for hydroureter. The urinary bladder appears normal for the degree of distention. Stomach/Bowel: Stomach is unremarkable. No gastric wall thickening. No evidence of outlet obstruction. Duodenum is normally positioned as is the ligament of Treitz. No small bowel wall thickening. No small bowel dilatation. The terminal ileum is normal. The appendix is normal. No gross colonic mass. No colonic wall thickening. Fluid is visible in the left colon and rectum, a finding that can be seen in the setting of clinical diarrhea. Vascular/Lymphatic: There is mild atherosclerotic calcification of the abdominal aorta without aneurysm. There is no gastrohepatic or hepatoduodenal ligament lymphadenopathy. No retroperitoneal or mesenteric lymphadenopathy. No pelvic sidewall lymphadenopathy. Reproductive: The prostate gland and seminal vesicles are unremarkable. Other: No intraperitoneal free fluid. Musculoskeletal: No worrisome lytic or sclerotic osseous abnormality. Tiny umbilical hernia contains only fat. Review of the MIP images confirms the above findings. IMPRESSION: 1. No CT evidence for acute pulmonary embolus. No acute findings in the chest. 2. 3 mm posterior left lower lobe  pulmonary nodule. No follow-up needed if patient is low-risk.This recommendation follows the consensus statement: Guidelines for Management of Incidental Pulmonary Nodules Detected on CT Images: From the Fleischner Society 2017; Radiology 2017; 284:228-243. 3. Fluid is visible in the left colon and rectum, a finding that can be seen in the setting of clinical diarrhea. 4.  Aortic Atherosclerosis (ICD10-I70.0). Electronically Signed   By: Donnal Fusi M.D.   On: 11/05/2023 12:45    Procedures Procedures    Medications Ordered in ED Medications  sodium chloride 0.9 % bolus 1,000 mL (1,000 mLs Intravenous New Bag/Given 11/05/23 1214)  ondansetron (ZOFRAN) injection 4 mg (4 mg Intravenous Given 11/05/23 1138)  iohexol (OMNIPAQUE) 350 MG/ML injection 75 mL (75 mLs Intravenous Contrast Given 11/05/23 1156)    ED Course/ Medical Decision Making/ A&P  Medical Decision Making Amount and/or Complexity of Data Reviewed Labs: ordered. Radiology: ordered.  Risk Prescription drug management.   This patient is a 58 y.o. male who presents to the ED for concern of multiple complaints, this involves an extensive number of treatment options, and is a complaint that carries with it a high risk of complications and morbidity. The emergent differential diagnosis prior to evaluation includes, but is not limited to,  ACS/PE, AAA, gastroenteritis, appendicitis, Bowel obstruction, Bowel perforation. Gastroparesis, DKA, Hernia, Inflammatory bowel disease, mesenteric ischemia, pancreatitis, peritonitis SBP, volvulus.  This is not an exhaustive differential.   Past Medical History / Co-morbidities / Social History:  has a past medical history of Hypertension.  Additional history: Chart reviewed.  Physical Exam: Physical exam performed. The pertinent findings include: anxious appearing, lung sounds CTA, generalized abdominal TTP without rebound or guarding. Negative Murphy's  sign.   Lab Tests: I ordered, and personally interpreted labs.  The pertinent results include:  WBC 11.1, biacarb 17, glucose 103, T bili 2.3, anion gap 18. UA with ketones. Troponin WNL   Imaging Studies: I ordered imaging studies including CTA PE, CT abdomen pelvis. I independently visualized and interpreted imaging which showed   1. No CT evidence for acute pulmonary embolus. No acute findings in the chest. 2. 3 mm posterior left lower lobe pulmonary nodule. No follow-up needed if patient is low-risk 3. Fluid is visible in the left colon and rectum, a finding that can be seen in the setting of clinical diarrhea. 4.  Aortic Atherosclerosis  I agree with the radiologist interpretation.   Medications: I ordered medication including zofran , fluids  for dehydration, nausea. Reevaluation of the patient after these medicines showed that the patient improved. I have reviewed the patients home medicines and have made adjustments as needed.  Consultations Obtained: I requested consultation with the TTS, appreciate their recommendations   Disposition:  Patients labs and imaging overall benign. Likely dehydrated due to ketonuria, decreased bicarb and elevated anion gap. Suspect starvation ketosis. Do not suspect euvolemic DKA. After fluids, patient states he feels substantially improved and is able to eat crackers and drink water without any residual symptoms. He states that his diarrheal symptoms are mild in nature. He has no risk factors or signs/symptoms to suspect cdiff or infectious diarrhea to require testing at this time.  Is still endorsing SI and requests TTS consult which has been placed. Will send rx for Zofran  as well for when he is able to go home. He is otherwise medically cleared.   I discussed this case with my attending physician Dr. Nora Beal who cosigned this note including patient's presenting symptoms, physical exam, and planned diagnostics and interventions. Attending  physician stated agreement with plan or made changes to plan which were implemented.    Final Clinical Impression(s) / ED Diagnoses Final diagnoses:  Diarrhea, unspecified type  Dehydration  Suicidal ideation    Rx / DC Orders ED Discharge Orders     None         Sherra Dk, PA-C 11/05/23 1352    Kingsley, Victoria K, DO 11/05/23 1443

## 2023-11-05 NOTE — ED Notes (Signed)
 Transported to CT

## 2023-11-05 NOTE — ED Triage Notes (Signed)
 Patient presents to ed via [facility] from work , states he has been having n/v and abd. Cramps for several days, states he went to work today and started feeling very weird c/o numbness and generalized weakness. States he has loss approx. 18 lbs. In the last month. C/o lots of pressure when trying to urinate. States he was able to eat 3 crackers. Patient is very tearful and states last night he felt like hurting himself, no plan and states he doesn't know why he feels this way.

## 2023-11-05 NOTE — Discharge Instructions (Addendum)
 Recommend continue Celexa from 10 mg by mouth daily Recommend close outpatient follow-up with the patient's primary care provider and medication management provider Recommend strict adherence to safety plan created today with spouse   Safety Plan IZEKIEL FLEGEL will reach out to Wife, call 911 or call mobile crisis, or go to nearest emergency room if condition worsens or if suicidal thoughts become active Patients' will follow up with PCP for outpatient psychiatric services (medication management).  The suicide prevention education provided includes the following: Suicide risk factors Suicide prevention and interventions National Suicide Hotline telephone number [facility] assessment telephone number [facility] Emergency Assistance 911 [facility] and/or Residential Mobile Crisis Unit telephone number Request made of family/significant other to:  Wife Remove weapons (e.g., guns, rifles, knives), all items previously/currently identified as safety concern.   Remove drugs/medications (over the counter, prescriptions, illicit drugs), all items previously/currently identified as a safety concern.

## 2023-11-09 DIAGNOSIS — F331 Major depressive disorder, recurrent, moderate: Secondary | ICD-10-CM | POA: Diagnosis not present

## 2023-12-03 ENCOUNTER — Inpatient Hospital Stay (HOSPITAL_COMMUNITY)
Admission: EM | Admit: 2023-12-03 | Discharge: 2023-12-06 | DRG: 200 | Disposition: A | Discharge status: Home / Self Care | Attending: General Surgery | Admitting: General Surgery

## 2023-12-03 ENCOUNTER — Other Ambulatory Visit: Payer: Self-pay

## 2023-12-03 ENCOUNTER — Encounter (HOSPITAL_COMMUNITY): Payer: Self-pay

## 2023-12-03 ENCOUNTER — Emergency Department (HOSPITAL_COMMUNITY)

## 2023-12-03 DIAGNOSIS — R0902 Hypoxemia: Secondary | ICD-10-CM | POA: Diagnosis not present

## 2023-12-03 DIAGNOSIS — F1090 Alcohol use, unspecified, uncomplicated: Secondary | ICD-10-CM | POA: Diagnosis present

## 2023-12-03 DIAGNOSIS — W19XXXA Unspecified fall, initial encounter: Secondary | ICD-10-CM | POA: Diagnosis not present

## 2023-12-03 DIAGNOSIS — I10 Essential (primary) hypertension: Secondary | ICD-10-CM | POA: Diagnosis not present

## 2023-12-03 DIAGNOSIS — S4992XA Unspecified injury of left shoulder and upper arm, initial encounter: Secondary | ICD-10-CM | POA: Diagnosis not present

## 2023-12-03 DIAGNOSIS — R001 Bradycardia, unspecified: Secondary | ICD-10-CM | POA: Diagnosis not present

## 2023-12-03 DIAGNOSIS — S301XXA Contusion of abdominal wall, initial encounter: Secondary | ICD-10-CM | POA: Diagnosis not present

## 2023-12-03 DIAGNOSIS — F17290 Nicotine dependence, other tobacco product, uncomplicated: Secondary | ICD-10-CM | POA: Diagnosis present

## 2023-12-03 DIAGNOSIS — S43102A Unspecified dislocation of left acromioclavicular joint, initial encounter: Secondary | ICD-10-CM | POA: Diagnosis present

## 2023-12-03 DIAGNOSIS — S62616A Displaced fracture of proximal phalanx of right little finger, initial encounter for closed fracture: Secondary | ICD-10-CM | POA: Diagnosis present

## 2023-12-03 DIAGNOSIS — I7121 Aneurysm of the ascending aorta, without rupture: Secondary | ICD-10-CM | POA: Diagnosis not present

## 2023-12-03 DIAGNOSIS — S0031XA Abrasion of nose, initial encounter: Secondary | ICD-10-CM | POA: Diagnosis not present

## 2023-12-03 DIAGNOSIS — S270XXA Traumatic pneumothorax, initial encounter: Principal | ICD-10-CM | POA: Diagnosis present

## 2023-12-03 DIAGNOSIS — Y92410 Unspecified street and highway as the place of occurrence of the external cause: Secondary | ICD-10-CM

## 2023-12-03 DIAGNOSIS — Y902 Blood alcohol level of 40-59 mg/100 ml: Secondary | ICD-10-CM | POA: Diagnosis present

## 2023-12-03 DIAGNOSIS — T797XXA Traumatic subcutaneous emphysema, initial encounter: Secondary | ICD-10-CM | POA: Diagnosis not present

## 2023-12-03 DIAGNOSIS — Z79899 Other long term (current) drug therapy: Secondary | ICD-10-CM

## 2023-12-03 DIAGNOSIS — S62001A Unspecified fracture of navicular [scaphoid] bone of right wrist, initial encounter for closed fracture: Secondary | ICD-10-CM | POA: Diagnosis present

## 2023-12-03 DIAGNOSIS — Z23 Encounter for immunization: Secondary | ICD-10-CM

## 2023-12-03 DIAGNOSIS — S52514A Nondisplaced fracture of right radial styloid process, initial encounter for closed fracture: Secondary | ICD-10-CM | POA: Diagnosis not present

## 2023-12-03 DIAGNOSIS — S3993XA Unspecified injury of pelvis, initial encounter: Secondary | ICD-10-CM | POA: Diagnosis not present

## 2023-12-03 DIAGNOSIS — S2242XA Multiple fractures of ribs, left side, initial encounter for closed fracture: Secondary | ICD-10-CM | POA: Diagnosis not present

## 2023-12-03 DIAGNOSIS — S61214A Laceration without foreign body of right ring finger without damage to nail, initial encounter: Secondary | ICD-10-CM | POA: Diagnosis not present

## 2023-12-03 DIAGNOSIS — J939 Pneumothorax, unspecified: Secondary | ICD-10-CM | POA: Diagnosis not present

## 2023-12-03 DIAGNOSIS — S62101A Fracture of unspecified carpal bone, right wrist, initial encounter for closed fracture: Secondary | ICD-10-CM

## 2023-12-03 DIAGNOSIS — S3992XA Unspecified injury of lower back, initial encounter: Secondary | ICD-10-CM | POA: Diagnosis not present

## 2023-12-03 DIAGNOSIS — S2249XA Multiple fractures of ribs, unspecified side, initial encounter for closed fracture: Secondary | ICD-10-CM | POA: Diagnosis present

## 2023-12-03 DIAGNOSIS — S299XXA Unspecified injury of thorax, initial encounter: Secondary | ICD-10-CM | POA: Diagnosis not present

## 2023-12-03 DIAGNOSIS — J90 Pleural effusion, not elsewhere classified: Secondary | ICD-10-CM | POA: Diagnosis not present

## 2023-12-03 DIAGNOSIS — J982 Interstitial emphysema: Secondary | ICD-10-CM | POA: Diagnosis not present

## 2023-12-03 DIAGNOSIS — S0990XA Unspecified injury of head, initial encounter: Secondary | ICD-10-CM | POA: Diagnosis not present

## 2023-12-03 DIAGNOSIS — Z4682 Encounter for fitting and adjustment of non-vascular catheter: Secondary | ICD-10-CM | POA: Diagnosis not present

## 2023-12-03 DIAGNOSIS — S199XXA Unspecified injury of neck, initial encounter: Secondary | ICD-10-CM | POA: Diagnosis not present

## 2023-12-03 DIAGNOSIS — J439 Emphysema, unspecified: Secondary | ICD-10-CM | POA: Diagnosis not present

## 2023-12-03 DIAGNOSIS — R918 Other nonspecific abnormal finding of lung field: Secondary | ICD-10-CM | POA: Diagnosis not present

## 2023-12-03 DIAGNOSIS — S52511A Displaced fracture of right radial styloid process, initial encounter for closed fracture: Secondary | ICD-10-CM | POA: Diagnosis not present

## 2023-12-03 DIAGNOSIS — S3991XA Unspecified injury of abdomen, initial encounter: Secondary | ICD-10-CM | POA: Diagnosis not present

## 2023-12-03 DIAGNOSIS — F1290 Cannabis use, unspecified, uncomplicated: Secondary | ICD-10-CM | POA: Diagnosis not present

## 2023-12-03 DIAGNOSIS — S52501A Unspecified fracture of the lower end of right radius, initial encounter for closed fracture: Secondary | ICD-10-CM | POA: Diagnosis not present

## 2023-12-03 LAB — CBC WITH DIFFERENTIAL/PLATELET
Abs Immature Granulocytes: 0.1 10*3/uL — ABNORMAL HIGH (ref 0.00–0.07)
Basophils Absolute: 0.1 10*3/uL (ref 0.0–0.1)
Basophils Relative: 1 %
Eosinophils Absolute: 0.4 10*3/uL (ref 0.0–0.5)
Eosinophils Relative: 3 %
HCT: 40.9 % (ref 39.0–52.0)
Hemoglobin: 13.6 g/dL (ref 13.0–17.0)
Immature Granulocytes: 1 %
Lymphocytes Relative: 16 %
Lymphs Abs: 2.3 10*3/uL (ref 0.7–4.0)
MCH: 31.3 pg (ref 26.0–34.0)
MCHC: 33.3 g/dL (ref 30.0–36.0)
MCV: 94 fL (ref 80.0–100.0)
Monocytes Absolute: 0.8 10*3/uL (ref 0.1–1.0)
Monocytes Relative: 5 %
Neutro Abs: 11 10*3/uL — ABNORMAL HIGH (ref 1.7–7.7)
Neutrophils Relative %: 74 %
Platelets: 340 10*3/uL (ref 150–400)
RBC: 4.35 MIL/uL (ref 4.22–5.81)
RDW: 12.5 % (ref 11.5–15.5)
WBC: 14.6 10*3/uL — ABNORMAL HIGH (ref 4.0–10.5)
nRBC: 0 % (ref 0.0–0.2)

## 2023-12-03 LAB — COMPREHENSIVE METABOLIC PANEL WITH GFR
ALT: 66 U/L — ABNORMAL HIGH (ref 0–44)
AST: 69 U/L — ABNORMAL HIGH (ref 15–41)
Albumin: 4.1 g/dL (ref 3.5–5.0)
Alkaline Phosphatase: 41 U/L (ref 38–126)
Anion gap: 12 (ref 5–15)
BUN: 13 mg/dL (ref 6–20)
CO2: 23 mmol/L (ref 22–32)
Calcium: 9.2 mg/dL (ref 8.9–10.3)
Chloride: 106 mmol/L (ref 98–111)
Creatinine, Ser: 0.99 mg/dL (ref 0.61–1.24)
GFR, Estimated: >60 mL/min (ref >60)
Glucose, Bld: 128 mg/dL — ABNORMAL HIGH (ref 70–99)
Potassium: 3.8 mmol/L (ref 3.5–5.1)
Sodium: 141 mmol/L (ref 135–145)
Total Bilirubin: 0.7 mg/dL (ref 0.0–1.2)
Total Protein: 7 g/dL (ref 6.5–8.1)

## 2023-12-03 LAB — I-STAT CG4 LACTIC ACID, ED: Lactic Acid, Venous: 2.8 mmol/L (ref 0.5–1.9)

## 2023-12-03 LAB — I-STAT CHEM 8, ED
BUN: 16 mg/dL (ref 6–20)
Calcium, Ion: 1.1 mmol/L — ABNORMAL LOW (ref 1.15–1.40)
Chloride: 107 mmol/L (ref 98–111)
Creatinine, Ser: 1.1 mg/dL (ref 0.61–1.24)
Glucose, Bld: 125 mg/dL — ABNORMAL HIGH (ref 70–99)
HCT: 41 % (ref 39.0–52.0)
Hemoglobin: 13.9 g/dL (ref 13.0–17.0)
Potassium: 3.9 mmol/L (ref 3.5–5.1)
Sodium: 141 mmol/L (ref 135–145)
TCO2: 22 mmol/L (ref 22–32)

## 2023-12-03 LAB — PROTIME-INR
INR: 1.1 (ref 0.8–1.2)
Prothrombin Time: 14.3 s (ref 11.4–15.2)

## 2023-12-03 MED ORDER — CEFAZOLIN SODIUM-DEXTROSE 2-4 GM/100ML-% IV SOLN
2.0000 g | INTRAVENOUS | Status: AC
  Administered 2023-12-03: 2 g via INTRAVENOUS
  Filled 2023-12-03: qty 100

## 2023-12-03 MED ORDER — ONDANSETRON HCL 4 MG/2ML IJ SOLN
INTRAMUSCULAR | Status: AC
  Administered 2023-12-03: 4 mg via INTRAVENOUS
  Filled 2023-12-03: qty 2

## 2023-12-03 MED ORDER — IOHEXOL 350 MG/ML SOLN
75.0000 mL | Freq: Once | INTRAVENOUS | Status: AC | PRN
  Administered 2023-12-03: 75 mL via INTRAVENOUS

## 2023-12-03 MED ORDER — TETANUS-DIPHTH-ACELL PERTUSSIS 5-2.5-18.5 LF-MCG/0.5 IM SUSY
0.5000 mL | PREFILLED_SYRINGE | Freq: Once | INTRAMUSCULAR | Status: AC
Start: 2023-12-03 — End: 2023-12-03
  Administered 2023-12-03: 0.5 mL via INTRAMUSCULAR
  Filled 2023-12-03: qty 0.5

## 2023-12-03 MED ORDER — FENTANYL CITRATE PF 50 MCG/ML IJ SOSY
100.0000 ug | PREFILLED_SYRINGE | Freq: Once | INTRAMUSCULAR | Status: AC
  Administered 2023-12-03: 100 ug via INTRAVENOUS
  Filled 2023-12-03: qty 2

## 2023-12-03 MED ORDER — ONDANSETRON HCL 4 MG/2ML IJ SOLN: 4.0000 mg | Freq: Once | INTRAMUSCULAR | Status: AC

## 2023-12-03 MED ORDER — TETANUS-DIPHTH-ACELL PERTUSSIS 5-2.5-18.5 LF-MCG/0.5 IM SUSY: 0.5000 mL | PREFILLED_SYRINGE | Freq: Once | INTRAMUSCULAR | Status: DC

## 2023-12-03 NOTE — ED Provider Notes (Signed)
  Physical Exam  BP (!) 130/90   Pulse 60   Ht 5' 11 (1.803 m)   Wt 87.1 kg   SpO2 96%   BMI 26.78 kg/m   Physical Exam  Procedures  Ultrasound ED FAST  Date/Time: 12/03/2023 10:34 PM  Performed by: Logan Ubaldo NOVAK, PA-C Authorized by: Logan Ubaldo NOVAK, PA-C  Procedure details:    Indications: blunt abdominal trauma       Assess for:  Hemothorax, intra-abdominal fluid, pericardial effusion and pneumothorax    Technique:  Abdominal, cardiac and chest    Images: archived      Abdominal findings:    L kidney:  Visualized   R kidney:  Visualized   Liver:  Visualized    Bladder:  Visualized   Hepatorenal space visualized: identified     Splenorenal space: identified     Rectovesical free fluid: not identified     Splenorenal free fluid: not identified     Hepatorenal space free fluid: not identified   Cardiac findings:    Heart:  Visualized   Pericardial effusion: not identified   Chest findings:    L lung sliding: identified     R lung sliding: identified   Comments:     Negative e-fast exam   ED Course / MDM    Medical Decision Making Amount and/or Complexity of Data Reviewed Labs: ordered. Radiology: ordered.  Risk Prescription drug management.          [name] 12/03/23 2235    Albertina Dixon, MD 12/04/23 [phone]
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
## 2023-12-04 ENCOUNTER — Inpatient Hospital Stay (HOSPITAL_COMMUNITY)

## 2023-12-04 ENCOUNTER — Emergency Department (HOSPITAL_COMMUNITY)

## 2023-12-04 DIAGNOSIS — S2242XA Multiple fractures of ribs, left side, initial encounter for closed fracture: Secondary | ICD-10-CM | POA: Diagnosis present

## 2023-12-04 DIAGNOSIS — Z79899 Other long term (current) drug therapy: Secondary | ICD-10-CM | POA: Diagnosis not present

## 2023-12-04 DIAGNOSIS — S270XXA Traumatic pneumothorax, initial encounter: Secondary | ICD-10-CM | POA: Diagnosis present

## 2023-12-04 DIAGNOSIS — S43102A Unspecified dislocation of left acromioclavicular joint, initial encounter: Secondary | ICD-10-CM | POA: Diagnosis present

## 2023-12-04 DIAGNOSIS — Y92410 Unspecified street and highway as the place of occurrence of the external cause: Secondary | ICD-10-CM | POA: Diagnosis not present

## 2023-12-04 DIAGNOSIS — Z23 Encounter for immunization: Secondary | ICD-10-CM | POA: Diagnosis not present

## 2023-12-04 DIAGNOSIS — F17290 Nicotine dependence, other tobacco product, uncomplicated: Secondary | ICD-10-CM | POA: Diagnosis present

## 2023-12-04 DIAGNOSIS — F1090 Alcohol use, unspecified, uncomplicated: Secondary | ICD-10-CM | POA: Diagnosis present

## 2023-12-04 DIAGNOSIS — S0031XA Abrasion of nose, initial encounter: Secondary | ICD-10-CM | POA: Diagnosis present

## 2023-12-04 DIAGNOSIS — I7121 Aneurysm of the ascending aorta, without rupture: Secondary | ICD-10-CM | POA: Diagnosis present

## 2023-12-04 DIAGNOSIS — S62616A Displaced fracture of proximal phalanx of right little finger, initial encounter for closed fracture: Secondary | ICD-10-CM | POA: Diagnosis present

## 2023-12-04 DIAGNOSIS — S301XXA Contusion of abdominal wall, initial encounter: Secondary | ICD-10-CM | POA: Diagnosis present

## 2023-12-04 DIAGNOSIS — S2249XA Multiple fractures of ribs, unspecified side, initial encounter for closed fracture: Secondary | ICD-10-CM | POA: Diagnosis present

## 2023-12-04 DIAGNOSIS — Y902 Blood alcohol level of 40-59 mg/100 ml: Secondary | ICD-10-CM | POA: Diagnosis present

## 2023-12-04 DIAGNOSIS — F1290 Cannabis use, unspecified, uncomplicated: Secondary | ICD-10-CM | POA: Diagnosis present

## 2023-12-04 DIAGNOSIS — S61214A Laceration without foreign body of right ring finger without damage to nail, initial encounter: Secondary | ICD-10-CM | POA: Diagnosis present

## 2023-12-04 DIAGNOSIS — I10 Essential (primary) hypertension: Secondary | ICD-10-CM | POA: Diagnosis present

## 2023-12-04 DIAGNOSIS — J982 Interstitial emphysema: Secondary | ICD-10-CM | POA: Diagnosis present

## 2023-12-04 DIAGNOSIS — S62001A Unspecified fracture of navicular [scaphoid] bone of right wrist, initial encounter for closed fracture: Secondary | ICD-10-CM | POA: Diagnosis present

## 2023-12-04 DIAGNOSIS — S52514A Nondisplaced fracture of right radial styloid process, initial encounter for closed fracture: Secondary | ICD-10-CM | POA: Diagnosis present

## 2023-12-04 LAB — URINALYSIS, ROUTINE W REFLEX MICROSCOPIC
Bilirubin Urine: NEGATIVE
Glucose, UA: NEGATIVE mg/dL
Ketones, ur: NEGATIVE mg/dL
Leukocytes,Ua: NEGATIVE
Nitrite: NEGATIVE
Protein, ur: NEGATIVE mg/dL
Specific Gravity, Urine: 1.02 (ref 1.005–1.030)
pH: 5 (ref 5.0–8.0)

## 2023-12-04 LAB — TYPE AND SCREEN
ABO/RH(D): A NEG
Antibody Screen: NEGATIVE

## 2023-12-04 LAB — BASIC METABOLIC PANEL WITH GFR
Anion gap: 10 (ref 5–15)
BUN: 12 mg/dL (ref 6–20)
CO2: 22 mmol/L (ref 22–32)
Calcium: 8.6 mg/dL — ABNORMAL LOW (ref 8.9–10.3)
Chloride: 105 mmol/L (ref 98–111)
Creatinine, Ser: 0.74 mg/dL (ref 0.61–1.24)
GFR, Estimated: >60 mL/min (ref >60)
Glucose, Bld: 124 mg/dL — ABNORMAL HIGH (ref 70–99)
Potassium: 3.9 mmol/L (ref 3.5–5.1)
Sodium: 137 mmol/L (ref 135–145)

## 2023-12-04 LAB — CBC
HCT: 38.1 % — ABNORMAL LOW (ref 39.0–52.0)
Hemoglobin: 12.8 g/dL — ABNORMAL LOW (ref 13.0–17.0)
MCH: 31.5 pg (ref 26.0–34.0)
MCHC: 33.6 g/dL (ref 30.0–36.0)
MCV: 93.8 fL (ref 80.0–100.0)
Platelets: 288 10*3/uL (ref 150–400)
RBC: 4.06 MIL/uL — ABNORMAL LOW (ref 4.22–5.81)
RDW: 12.8 % (ref 11.5–15.5)
WBC: 12.1 10*3/uL — ABNORMAL HIGH (ref 4.0–10.5)
nRBC: 0 % (ref 0.0–0.2)

## 2023-12-04 LAB — RAPID URINE DRUG SCREEN, HOSP PERFORMED
Amphetamines: NOT DETECTED
Barbiturates: NOT DETECTED
Benzodiazepines: NOT DETECTED
Cocaine: NOT DETECTED
Opiates: NOT DETECTED
Tetrahydrocannabinol: POSITIVE — AB

## 2023-12-04 LAB — HIV ANTIBODY (ROUTINE TESTING W REFLEX): HIV Screen 4th Generation wRfx: NONREACTIVE

## 2023-12-04 LAB — ETHANOL: Alcohol, Ethyl (B): 54 mg/dL — ABNORMAL HIGH (ref <15)

## 2023-12-04 MED ORDER — KETAMINE HCL 10 MG/ML IJ SOLN
INTRAMUSCULAR | Status: AC | PRN
  Administered 2023-12-04 (×2): 20 mg via INTRAVENOUS

## 2023-12-04 MED ORDER — METHOCARBAMOL 500 MG PO TABS
500.0000 mg | ORAL_TABLET | Freq: Three times a day (TID) | ORAL | Status: DC
  Administered 2023-12-04 – 2023-12-06 (×7): 500 mg via ORAL
  Filled 2023-12-04 (×7): qty 1

## 2023-12-04 MED ORDER — METHOCARBAMOL 1000 MG/10ML IJ SOLN
500.0000 mg | Freq: Three times a day (TID) | INTRAMUSCULAR | Status: DC
  Administered 2023-12-06: 500 mg via INTRAVENOUS
  Filled 2023-12-04: qty 10

## 2023-12-04 MED ORDER — HYDRALAZINE HCL 20 MG/ML IJ SOLN: 10.0000 mg | INTRAMUSCULAR | Status: DC | PRN

## 2023-12-04 MED ORDER — ONDANSETRON 4 MG PO TBDP: 4.0000 mg | ORAL_TABLET | Freq: Four times a day (QID) | ORAL | Status: DC | PRN

## 2023-12-04 MED ORDER — ONDANSETRON HCL 4 MG/2ML IJ SOLN
INTRAMUSCULAR | Status: AC
  Filled 2023-12-04: qty 2

## 2023-12-04 MED ORDER — SODIUM CHLORIDE 0.9% FLUSH: 10.0000 mL | Freq: Three times a day (TID) | INTRAVENOUS | Status: DC

## 2023-12-04 MED ORDER — IOHEXOL 350 MG/ML SOLN
75.0000 mL | Freq: Once | INTRAVENOUS | Status: AC | PRN
  Administered 2023-12-04: 75 mL via INTRAVENOUS

## 2023-12-04 MED ORDER — SODIUM CHLORIDE 0.9 % IV SOLN
INTRAVENOUS | Status: AC | PRN
  Administered 2023-12-04: 1000 mL via INTRAVENOUS

## 2023-12-04 MED ORDER — LACTATED RINGERS IV SOLN: INTRAVENOUS | Status: AC

## 2023-12-04 MED ORDER — ENOXAPARIN SODIUM 30 MG/0.3ML IJ SOSY
30.0000 mg | PREFILLED_SYRINGE | Freq: Two times a day (BID) | INTRAMUSCULAR | Status: DC
  Administered 2023-12-05 – 2023-12-06 (×3): 30 mg via SUBCUTANEOUS
  Filled 2023-12-04 (×3): qty 0.3

## 2023-12-04 MED ORDER — FENTANYL CITRATE PF 50 MCG/ML IJ SOSY
50.0000 ug | PREFILLED_SYRINGE | Freq: Once | INTRAMUSCULAR | Status: AC
  Administered 2023-12-04: 50 ug via INTRAVENOUS
  Filled 2023-12-04: qty 1

## 2023-12-04 MED ORDER — HYDROMORPHONE HCL 1 MG/ML IJ SOLN
1.0000 mg | INTRAMUSCULAR | Status: DC | PRN
  Administered 2023-12-04 – 2023-12-06 (×12): 1 mg via INTRAVENOUS
  Filled 2023-12-04 (×12): qty 1

## 2023-12-04 MED ORDER — CALCIUM GLUCONATE-NACL 2-0.675 GM/100ML-% IV SOLN
2.0000 g | Freq: Once | INTRAVENOUS | Status: AC
  Administered 2023-12-04: 2000 mg via INTRAVENOUS
  Filled 2023-12-04 (×2): qty 100

## 2023-12-04 MED ORDER — KETAMINE HCL 50 MG/5ML IJ SOSY
1.0000 mg/kg | PREFILLED_SYRINGE | Freq: Once | INTRAMUSCULAR | Status: DC
  Filled 2023-12-04: qty 10

## 2023-12-04 MED ORDER — ACETAMINOPHEN 500 MG PO TABS
1000.0000 mg | ORAL_TABLET | Freq: Four times a day (QID) | ORAL | Status: DC
  Administered 2023-12-04 – 2023-12-06 (×10): 1000 mg via ORAL
  Filled 2023-12-04 (×10): qty 2

## 2023-12-04 MED ORDER — ONDANSETRON HCL 4 MG/2ML IJ SOLN
4.0000 mg | Freq: Four times a day (QID) | INTRAMUSCULAR | Status: DC | PRN
  Administered 2023-12-04: 4 mg via INTRAVENOUS

## 2023-12-04 MED ORDER — OXYCODONE HCL 5 MG PO TABS
5.0000 mg | ORAL_TABLET | ORAL | Status: DC | PRN
  Administered 2023-12-04 – 2023-12-06 (×7): 10 mg via ORAL
  Filled 2023-12-04 (×7): qty 2

## 2023-12-04 MED ORDER — POLYETHYLENE GLYCOL 3350 17 G PO PACK: 17.0000 g | PACK | Freq: Every day | ORAL | Status: DC | PRN

## 2023-12-04 MED ORDER — GABAPENTIN 300 MG PO CAPS
300.0000 mg | ORAL_CAPSULE | Freq: Three times a day (TID) | ORAL | Status: DC
  Administered 2023-12-04 – 2023-12-06 (×8): 300 mg via ORAL
  Filled 2023-12-04 (×8): qty 1

## 2023-12-04 NOTE — ED Provider Notes (Signed)
 CHEST TUBE INSERTION  Date/Time: 12/04/2023 2:10 AM  Performed by: Theadore Ozell HERO, MD Authorized by: Theadore Ozell HERO, MD   Consent:    Consent obtained:  Verbal and written   Consent given by:  Patient   Risks, benefits, and alternatives were discussed: yes     Risks discussed:  Bleeding, damage to surrounding structures, infection, incomplete drainage, nerve damage and pain   Alternatives discussed:  No treatment Universal protocol:    Procedure explained and questions answered to patient or proxy's satisfaction: yes     Immediately prior to procedure, a time out was called: yes     Patient identity confirmed:  Verbally with patient and arm band Pre-procedure details:    Skin preparation:  Chlorhexidine   Preparation: Patient was prepped and draped in the usual sterile fashion   Sedation:    Sedation type:  Anxiolysis Anesthesia:    Anesthesia method:  Local infiltration   Local anesthetic:  Lidocaine 1% w/o epi Procedure details:    Placement location:  L anterior   Scalpel size:  11   Tube size (Fr):  Minicatheter   Ultrasound guidance: no     Tension pneumothorax: no   Comments:     Unable to confidently cannulate pleural space with wire, procedure paused and completed by Dr. Ann of general surgery, see separate note.     Theadore Ozell HERO, MD 12/04/23 [phone]

## 2023-12-04 NOTE — ED Notes (Signed)
 XR at bedside
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
## 2023-12-04 NOTE — Progress Notes (Signed)
 Orthopedic Tech Progress Note Patient Details:  Dave Hudson 02-24-1966 [id number]  Ortho Devices Type of Ortho Device: Ace wrap, Cotton web roll, Volar splint Ortho Device/Splint Location: RUE Ortho Device/Splint Interventions: Ordered, Application, Adjustment   Post Interventions Patient Tolerated: Well Instructions Provided: Care of device   Dave Hudson Dave Hudson 12/04/2023, 7:08 AM

## 2023-12-04 NOTE — ED Notes (Addendum)
 This nurse checked on the status of the patient's calcium  gluconate. The message sent stated that it was sent. This nurse never received the medication. Another message was sent regarding the missing dose.Attending MD made aware.

## 2023-12-04 NOTE — Progress Notes (Signed)
   12/04/23 0330  Spiritual Encounters  Type of Visit Initial  Care provided to: Pt and family  Conversation partners present during encounter Nurse  Reason for visit Routine spiritual support  OnCall Visit Yes  Interventions  Spiritual Care Interventions Made Established relationship of care and support;Compassionate presence;Prayer;Encouragement  Intervention Outcomes  Outcomes Connection to spiritual care;Awareness around self/spiritual resourses;Patient family open to resources    Chaplain followed up on Level 2 Page. Chaplain provided spiritual support as indicated above. Pt appeared to be doing well, and expressed gratitude for chaplain presence.

## 2023-12-04 NOTE — Progress Notes (Signed)
 Transition of Care [facility]) - CAGE-AID Screening   Patient Details  Name: Dave Hudson MRN: [id number] Date of Birth: 09-29-1965   [name], RN Phone Number: 12/04/2023, 3:44 AM   Clinical Narrative: Denies current tobacco use Occasional vaping usage Occasional THC or CBD vape (unsure which) Weekly etoh usage ( beer)  Pt reports he has never had withdrawal s/s   CAGE-AID Screening:    Have You Ever Felt You Ought to Cut Down on Your Drinking or Drug Use?: No Have People Annoyed You By Critizing Your Drinking Or Drug Use?: No Have You Felt Bad Or Guilty About Your Drinking Or Drug Use?: No Have You Ever Had a Drink or Used Drugs First Thing In The Morning to Steady Your Nerves or to Get Rid of a Hangover?: No CAGE-AID Score: 0  Substance Abuse Education Offered: Yes  Substance abuse interventions: Patient and Family Counseling

## 2023-12-04 NOTE — Progress Notes (Signed)
 Orthopedic Tech Progress Note Patient Details:  JERMELL HOLEMAN 09-25-1965 [id number]  Ortho Devices Type of Ortho Device: Thumb spica splint Splint Material: Fiberglass Ortho Device/Splint Location: RUE Ortho Device/Splint Interventions: Application   Post Interventions Patient Tolerated: Well Instructions Provided: Care of device  Jazell Rosenau E Anicia Leuthold 12/04/2023, 11:47 AM

## 2023-12-04 NOTE — Progress Notes (Signed)
 Orthopedic Tech Progress Note Patient Details:  Dave Hudson 1966-03-17 [id number]  Ortho Devices Type of Ortho Device: Finger splint Ortho Device/Splint Location: R PINKY Ortho Device/Splint Interventions: Ordered, Application   Post Interventions Patient Tolerated: Well Instructions Provided: Care of device   [name] 12/04/2023, 3:26 AM
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
## 2023-12-05 LAB — CBC
HCT: 36.3 % — ABNORMAL LOW (ref 39.0–52.0)
Hemoglobin: 12.1 g/dL — ABNORMAL LOW (ref 13.0–17.0)
MCH: 31.1 pg (ref 26.0–34.0)
MCHC: 33.3 g/dL (ref 30.0–36.0)
MCV: 93.3 fL (ref 80.0–100.0)
Platelets: 251 10*3/uL (ref 150–400)
RBC: 3.89 MIL/uL — ABNORMAL LOW (ref 4.22–5.81)
RDW: 12.8 % (ref 11.5–15.5)
WBC: 8.4 10*3/uL (ref 4.0–10.5)
nRBC: 0 % (ref 0.0–0.2)

## 2023-12-05 LAB — ABO/RH: ABO/RH(D): A NEG
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
## 2023-12-05 NOTE — Procedures (Addendum)
 Chest tube insertion  Date/Time: 12/05/2023 1:02 AM  Performed by: Ann Fine, MD Authorized by: Ann Fine, MD   Consent:    Consent obtained:  Verbal and written   Consent given by:  Patient   Risks discussed:  Bleeding, incomplete drainage, nerve damage, damage to surrounding structures, infection and pain   Alternatives discussed:  No treatment Pre-procedure details:    Skin preparation:  ChloraPrep Sedation:    Sedation type:  Anxiolysis Anesthesia (see MAR for exact dosages):    Anesthesia method:  Local infiltration Procedure details:    Placement location:  L anterior   Scalpel size:  11   Tube size (Fr):  Minicatheter   Technique: Seldinger     Ultrasound guidance: no     Tension pneumothorax: no     Tube connected to:  Suction   Drainage characteristics:  Air only   Suture material:  2-0 silk   Dressing:  4x4 sterile gauze Post-procedure details:    Post-insertion x-ray findings: tube in good position     Patient tolerance of procedure:  Tolerated well, no immediate complications Comments:     Called to assist Dr. Theadore with tube placement after meeting resistance on their attempt. Consent was obtained by Dr. Theadore. Patient was prepped and sterile field in place already and timeout previously performed when I arrived.

## 2023-12-05 NOTE — Plan of Care (Signed)
  Problem: Education: Goal: Knowledge of General Education information will improve Description: Including pain rating scale, medication(s)/side effects and non-pharmacologic comfort measures Outcome: Progressing   Problem: Clinical Measurements: Goal: Will remain free from infection Outcome: Progressing Goal: Diagnostic test results will improve Outcome: Progressing Goal: Respiratory complications will improve Outcome: Progressing   Problem: Activity: Goal: Risk for activity intolerance will decrease Outcome: Progressing   Problem: Nutrition: Goal: Adequate nutrition will be maintained Outcome: Progressing   Problem: Coping: Goal: Level of anxiety will decrease Outcome: Progressing   Problem: Elimination: Goal: Will not experience complications related to bowel motility Outcome: Progressing Goal: Will not experience complications related to urinary retention Outcome: Progressing   Problem: Pain Managment: Goal: General experience of comfort will improve and/or be controlled Outcome: Progressing   Problem: Safety: Goal: Ability to remain free from injury will improve Outcome: Progressing   Problem: Skin Integrity: Goal: Risk for impaired skin integrity will decrease Outcome: Progressing

## 2023-12-06 ENCOUNTER — Inpatient Hospital Stay (HOSPITAL_COMMUNITY)

## 2023-12-06 LAB — BASIC METABOLIC PANEL WITH GFR
Anion gap: 8 (ref 5–15)
BUN: 11 mg/dL (ref 6–20)
CO2: 29 mmol/L (ref 22–32)
Calcium: 8.8 mg/dL — ABNORMAL LOW (ref 8.9–10.3)
Chloride: 96 mmol/L — ABNORMAL LOW (ref 98–111)
Creatinine, Ser: 0.71 mg/dL (ref 0.61–1.24)
GFR, Estimated: >60 mL/min
Glucose, Bld: 138 mg/dL — ABNORMAL HIGH (ref 70–99)
Potassium: 4.5 mmol/L (ref 3.5–5.1)
Sodium: 133 mmol/L — ABNORMAL LOW (ref 135–145)

## 2023-12-06 LAB — CBC
HCT: 34.9 % — ABNORMAL LOW (ref 39.0–52.0)
Hemoglobin: 11.9 g/dL — ABNORMAL LOW (ref 13.0–17.0)
MCH: 31.9 pg (ref 26.0–34.0)
MCHC: 34.1 g/dL (ref 30.0–36.0)
MCV: 93.6 fL (ref 80.0–100.0)
Platelets: 241 10*3/uL (ref 150–400)
RBC: 3.73 MIL/uL — ABNORMAL LOW (ref 4.22–5.81)
RDW: 12.6 % (ref 11.5–15.5)
WBC: 8.4 10*3/uL (ref 4.0–10.5)
nRBC: 0 % (ref 0.0–0.2)

## 2023-12-06 MED ORDER — POLYETHYLENE GLYCOL 3350 17 G PO PACK: 17.0000 g | PACK | Freq: Every day | ORAL | Status: AC | PRN

## 2023-12-06 MED ORDER — METHOCARBAMOL 500 MG PO TABS: 500.0000 mg | ORAL_TABLET | Freq: Three times a day (TID) | ORAL | 0 refills | Status: AC | PRN

## 2023-12-06 MED ORDER — ACETAMINOPHEN 500 MG PO TABS: 1000.0000 mg | ORAL_TABLET | Freq: Four times a day (QID) | ORAL | Status: AC | PRN

## 2023-12-06 MED ORDER — GABAPENTIN 300 MG PO CAPS: 300.0000 mg | ORAL_CAPSULE | Freq: Three times a day (TID) | ORAL | 0 refills | Status: AC

## 2023-12-06 MED ORDER — OXYCODONE HCL 5 MG PO TABS: 5.0000 mg | ORAL_TABLET | ORAL | 0 refills | Status: AC | PRN

## 2023-12-06 MED ORDER — GUAIFENESIN ER 600 MG PO TB12: 600.0000 mg | ORAL_TABLET | Freq: Two times a day (BID) | ORAL | Status: AC | PRN

## 2023-12-06 MED ORDER — GUAIFENESIN ER 600 MG PO TB12
600.0000 mg | ORAL_TABLET | Freq: Two times a day (BID) | ORAL | Status: DC
  Administered 2023-12-06: 600 mg via ORAL
  Filled 2023-12-06: qty 1
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
## 2023-12-06 NOTE — Progress Notes (Signed)
 Patient given discharge instructions per policy. Prescription sent to patient's preferred pharmacy. IV removed without incident. Patient's belongings accounted for and returned to patient. Patient transported off unit in wheelchair. Left campus in personal vehicle accompanied by wife and teenage son.
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
## 2023-12-06 NOTE — Plan of Care (Signed)
   Problem: Activity: Goal: Risk for activity intolerance will decrease Outcome: Progressing   Problem: Nutrition: Goal: Adequate nutrition will be maintained Outcome: Progressing   Problem: Coping: Goal: Level of anxiety will decrease Outcome: Progressing   Problem: Elimination: Goal: Will not experience complications related to bowel motility Outcome: Progressing

## 2023-12-06 NOTE — Progress Notes (Signed)
 Chest tube removed per order and per protocol. Patient tolerated well. Vaseline gauze and 4X4 placed and secured with tape. Will continue to monitor.
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
## 2023-12-13 DIAGNOSIS — S62616A Displaced fracture of proximal phalanx of right little finger, initial encounter for closed fracture: Secondary | ICD-10-CM | POA: Diagnosis not present

## 2023-12-13 DIAGNOSIS — S52501A Unspecified fracture of the lower end of right radius, initial encounter for closed fracture: Secondary | ICD-10-CM | POA: Diagnosis not present

## 2023-12-13 DIAGNOSIS — S43102A Unspecified dislocation of left acromioclavicular joint, initial encounter: Secondary | ICD-10-CM | POA: Diagnosis not present

## 2023-12-15 DIAGNOSIS — J939 Pneumothorax, unspecified: Secondary | ICD-10-CM | POA: Diagnosis not present

## 2023-12-15 DIAGNOSIS — S2242XA Multiple fractures of ribs, left side, initial encounter for closed fracture: Secondary | ICD-10-CM | POA: Diagnosis not present

## 2023-12-15 DIAGNOSIS — S43102A Unspecified dislocation of left acromioclavicular joint, initial encounter: Secondary | ICD-10-CM | POA: Diagnosis not present

## 2023-12-20 DIAGNOSIS — S62616D Displaced fracture of proximal phalanx of right little finger, subsequent encounter for fracture with routine healing: Secondary | ICD-10-CM | POA: Diagnosis not present

## 2023-12-20 DIAGNOSIS — S52501D Unspecified fracture of the lower end of right radius, subsequent encounter for closed fracture with routine healing: Secondary | ICD-10-CM | POA: Diagnosis not present

## 2023-12-20 DIAGNOSIS — S43102D Unspecified dislocation of left acromioclavicular joint, subsequent encounter: Secondary | ICD-10-CM | POA: Diagnosis not present
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
## 2023-12-21 ENCOUNTER — Ambulatory Visit: Attending: Physician Assistant | Admitting: Occupational Therapy

## 2023-12-31 NOTE — Telephone Encounter (Signed)
 Pt would like to try the meloxicam. Pharmacy confirmed.

## 2024-01-03 DIAGNOSIS — S43102D Unspecified dislocation of left acromioclavicular joint, subsequent encounter: Secondary | ICD-10-CM | POA: Diagnosis not present

## 2024-01-03 DIAGNOSIS — S52501D Unspecified fracture of the lower end of right radius, subsequent encounter for closed fracture with routine healing: Secondary | ICD-10-CM | POA: Diagnosis not present

## 2024-01-03 DIAGNOSIS — S62616D Displaced fracture of proximal phalanx of right little finger, subsequent encounter for fracture with routine healing: Secondary | ICD-10-CM | POA: Diagnosis not present

## 2024-01-03 NOTE — Progress Notes (Signed)
 Orthopaedic Surgery Hand and Upper Extremity History and Physical Examination  CC: F/u R DRF, RSF P1 fx, and L AC dislocation  HPI 01/03/2024: History of Present Illness The patient is a 58 year old male returning for follow-up of right distal radius fracture, right small finger proximal phalanx fracture, and left AC joint separation.  Shoulder pain has improved to 6/10. He is concerned about elbow and pinky finger numbness, and opposite side back pain with certain shoulder movements. He manages without pain medication and is interested in surgical intervention for his shoulder.  He reports Right ulnar wrist pain. Right small finger is pain-free unless pulled back or pressure is applied to the knuckle, causing minor pain and swelling. Full extension is possible, but bending causes pain. Ibuprofen 800 mg is effective, occasionally supplemented for breakthrough pain. He slept on his left side last night, indicating improvement.  Brace use is uncomfortable due to unhealed ribs. He plans to see a chiropractor for back and hip misalignment post-healing. He experiences neck pinching when moving to one side and feels that he has a pinched nerve causing the left elbow and small finger symptoms.  He also reports rib tenderness where the chest tube was inserted.  MEDICATIONS Ibuprofen   Problem List:  Problem List[1]  Past Medical History: Medical History[2]   Medications: Current Rx ordered in [facility][3]  Allergies: Allergies as of 01/03/2024  . (No Known Allergies)    Past Surgical History: Surgical History[4]   Social History: Social History   Occupational History  . Not on file  Tobacco Use  . Smoking status: Former    Types: Cigarettes  . Smokeless tobacco: Never  Vaping Use  . Vaping status: Never Used  Substance and Sexual Activity  . Alcohol use: Yes  . Drug use: Not Currently  . Sexual activity: Not on file     Family History: Family History[5] Otherwise, no  relevant orthopaedic family history  ROS: Review of Systems: All systems reviewed and are negative except that mentioned in HPI  Work/Sport/Hobbies: See HPI  Physical Examination: Vitals:   01/03/24 1619  BP: (!) 126/92  Pulse: 65  Temp: 98 F (36.7 C)   Constitutional: Awake, alert.  WN/WD Appearance: healthy, no acute distress, well-groomed Affect: Normal HEENT: EOMI, mucous membranes moist CV: RRR Pulm: breathing comfortably  Right Upper Extremity / Hand Physical Exam Wrist flexion 90, extension 70. Right small finger PIP joint flexion 30-80, MP joint hyperextension 10, flexion 45. Pronosupination 90.  Results: Results Imaging Plain film radiographs of the right distal radius show interval healing of radial styloid fracture.   X-rays of the right small finger demonstrate displaced proximal phalanx fracture.  There is an articular fragment that is completely displaced.    Plain film bilateral clavicle view demonstrates around 100% displacement of the left clavicle at the [facility] joint.    Assessment/Plan:  Assessment & Plan 1. Right distal radius fracture: Interval healing of radial styloid fracture. Reports ulnar wrist pain. X-rays show no ulnar sided bony injury, possible soft tissue injury. - Continue bracing and coming out for gentle ROM. - Avoid lifting with right side   2. Right small finger proximal phalanx fracture: Bone misaligned. No pain unless finger pulled back or knuckle pressure. - Discussed treatment options and patient wants to pursue non-op treatment.  Discussed risks of non-op treatment including stiffness, pain, and post-traumatic arthritis.  He already has stiffness.  Reports no pain unless attempting to hyperextend MCP joint.   - Continue gentle ROM exercises -  Avoid heavy lifting  3. Left AC joint separation: Persistent pain.  Also reports some recent numbness. - Referral to Dr. Dale Hock for surgical evaluation   4. Numbness:  Possibly neck-related. - Monitor - Investigate if worsens  Follow-up - Follow up in 3 weeks with Alli    Alexander M. Chiaramonti, MD Hand and Upper Extremity Surgery [facility] Department of Orthopaedic Surgery [facility] 01/03/2024 5:10 PM       [1] Patient Active Problem List Diagnosis  . Displaced fracture of proximal phalanx of right little finger, initial encounter for closed fracture  . Closed fracture of right distal radius  . Closed dislocation of left acromioclavicular joint  [2] No past medical history on file. [3] Meds Ordered in [facility]  Medication Sig Dispense Refill  . acetaminophen  (TYLENOL ) 500 mg tablet Take 1,000 mg by mouth.    . atorvastatin  (LIPITOR) 20 mg tablet Take by mouth at bedtime.    SABRA desvenlafaxine (PRISTIQ) 25 mg Tb24 24 hr tablet Take 25 mg by mouth daily.    . gabapentin  (NEURONTIN ) 300 mg capsule Take 1 capsule by mouth in the morning and 1 capsule at noon and 1 capsule in the evening. Take with meals.    . hydrOXYzine (ATARAX) 25 mg tablet     . meloxicam (MOBIC) 15 mg tablet Take 1 tablet (15 mg total) by mouth daily. 30 tablet 1  . methocarbamoL  (ROBAXIN ) 500 mg tablet Take 500 mg by mouth every 8 (eight) hours as needed for muscle spasms.    . multivitamin (STRESS FORMULA) tab tablet Take 1 tablet by mouth daily.    SABRA omega 3-dha-epa-fish oil (OMEGA 3) 1,000 mg DR capsule Take 1 capsule by mouth daily.     No current [facility]-ordered facility-administered medications on file.  [4] No past surgical history on file. [5] Family History Problem Relation Name Age of Onset  . Arthritis Mother    . Diabetes Maternal Grandfather    . Hypertension Maternal Grandfather    . Heart disease Maternal Grandfather

## 2024-01-13 ENCOUNTER — Ambulatory Visit: Attending: Physician Assistant | Admitting: Occupational Therapy

## 2024-01-24 DIAGNOSIS — S43102A Unspecified dislocation of left acromioclavicular joint, initial encounter: Secondary | ICD-10-CM | POA: Diagnosis not present

## 2024-01-24 DIAGNOSIS — M25512 Pain in left shoulder: Secondary | ICD-10-CM | POA: Diagnosis not present

## 2024-01-25 DIAGNOSIS — S62616D Displaced fracture of proximal phalanx of right little finger, subsequent encounter for fracture with routine healing: Secondary | ICD-10-CM | POA: Diagnosis not present

## 2024-01-25 DIAGNOSIS — S52501D Unspecified fracture of the lower end of right radius, subsequent encounter for closed fracture with routine healing: Secondary | ICD-10-CM | POA: Diagnosis not present

## 2024-02-03 DIAGNOSIS — M25712 Osteophyte, left shoulder: Secondary | ICD-10-CM | POA: Diagnosis not present

## 2024-02-03 DIAGNOSIS — M75102 Unspecified rotator cuff tear or rupture of left shoulder, not specified as traumatic: Secondary | ICD-10-CM | POA: Diagnosis not present

## 2024-02-03 DIAGNOSIS — M7552 Bursitis of left shoulder: Secondary | ICD-10-CM | POA: Diagnosis not present

## 2024-02-03 DIAGNOSIS — S43132A Dislocation of left acromioclavicular joint, greater than 200% displacement, initial encounter: Secondary | ICD-10-CM | POA: Diagnosis not present

## 2024-02-03 DIAGNOSIS — S43102A Unspecified dislocation of left acromioclavicular joint, initial encounter: Secondary | ICD-10-CM | POA: Diagnosis not present

## 2024-02-03 DIAGNOSIS — M7582 Other shoulder lesions, left shoulder: Secondary | ICD-10-CM | POA: Diagnosis not present

## 2024-02-03 DIAGNOSIS — G8918 Other acute postprocedural pain: Secondary | ICD-10-CM | POA: Diagnosis not present

## 2024-02-08 DIAGNOSIS — S62616D Displaced fracture of proximal phalanx of right little finger, subsequent encounter for fracture with routine healing: Secondary | ICD-10-CM | POA: Diagnosis not present

## 2024-02-08 DIAGNOSIS — S52501D Unspecified fracture of the lower end of right radius, subsequent encounter for closed fracture with routine healing: Secondary | ICD-10-CM | POA: Diagnosis not present

## 2024-02-14 DIAGNOSIS — M25512 Pain in left shoulder: Secondary | ICD-10-CM | POA: Diagnosis not present

## 2024-02-28 DIAGNOSIS — M25612 Stiffness of left shoulder, not elsewhere classified: Secondary | ICD-10-CM | POA: Diagnosis not present

## 2024-02-28 DIAGNOSIS — M25512 Pain in left shoulder: Secondary | ICD-10-CM | POA: Diagnosis not present

## 2024-02-28 DIAGNOSIS — Z9889 Other specified postprocedural states: Secondary | ICD-10-CM | POA: Diagnosis not present

## 2024-03-08 DIAGNOSIS — S62616D Displaced fracture of proximal phalanx of right little finger, subsequent encounter for fracture with routine healing: Secondary | ICD-10-CM | POA: Diagnosis not present

## 2024-03-13 DIAGNOSIS — M25512 Pain in left shoulder: Secondary | ICD-10-CM | POA: Diagnosis not present

## 2024-03-20 DIAGNOSIS — M25512 Pain in left shoulder: Secondary | ICD-10-CM | POA: Diagnosis not present

## 2024-03-20 DIAGNOSIS — M25612 Stiffness of left shoulder, not elsewhere classified: Secondary | ICD-10-CM | POA: Diagnosis not present

## 2024-03-20 DIAGNOSIS — Z9889 Other specified postprocedural states: Secondary | ICD-10-CM | POA: Diagnosis not present

## 2024-04-06 DIAGNOSIS — S62616D Displaced fracture of proximal phalanx of right little finger, subsequent encounter for fracture with routine healing: Secondary | ICD-10-CM | POA: Diagnosis not present

## 2024-04-06 DIAGNOSIS — M20021 Boutonniere deformity of right finger(s): Secondary | ICD-10-CM | POA: Diagnosis not present

## 2024-04-12 DIAGNOSIS — Z9889 Other specified postprocedural states: Secondary | ICD-10-CM | POA: Diagnosis not present

## 2024-04-12 DIAGNOSIS — M25512 Pain in left shoulder: Secondary | ICD-10-CM | POA: Diagnosis not present

## 2024-04-12 DIAGNOSIS — M25441 Effusion, right hand: Secondary | ICD-10-CM | POA: Diagnosis not present

## 2024-04-12 DIAGNOSIS — M25612 Stiffness of left shoulder, not elsewhere classified: Secondary | ICD-10-CM | POA: Diagnosis not present

## 2024-04-17 DIAGNOSIS — E7800 Pure hypercholesterolemia, unspecified: Secondary | ICD-10-CM | POA: Diagnosis not present

## 2024-04-17 DIAGNOSIS — F322 Major depressive disorder, single episode, severe without psychotic features: Secondary | ICD-10-CM | POA: Diagnosis not present

## 2024-04-17 DIAGNOSIS — Z0000 Encounter for general adult medical examination without abnormal findings: Secondary | ICD-10-CM | POA: Diagnosis not present

## 2024-04-17 DIAGNOSIS — R7303 Prediabetes: Secondary | ICD-10-CM | POA: Diagnosis not present

## 2024-04-17 DIAGNOSIS — Z23 Encounter for immunization: Secondary | ICD-10-CM | POA: Diagnosis not present

## 2024-04-17 DIAGNOSIS — F1020 Alcohol dependence, uncomplicated: Secondary | ICD-10-CM | POA: Diagnosis not present

## 2024-04-24 DIAGNOSIS — S43102A Unspecified dislocation of left acromioclavicular joint, initial encounter: Secondary | ICD-10-CM | POA: Diagnosis not present

## 2024-04-27 DIAGNOSIS — S62616A Displaced fracture of proximal phalanx of right little finger, initial encounter for closed fracture: Secondary | ICD-10-CM | POA: Diagnosis not present

## 2024-05-16 DIAGNOSIS — M19041 Primary osteoarthritis, right hand: Secondary | ICD-10-CM | POA: Diagnosis not present

## 2024-05-16 DIAGNOSIS — S62616P Displaced fracture of proximal phalanx of right little finger, subsequent encounter for fracture with malunion: Secondary | ICD-10-CM | POA: Diagnosis not present

## 2024-05-16 DIAGNOSIS — S62616A Displaced fracture of proximal phalanx of right little finger, initial encounter for closed fracture: Secondary | ICD-10-CM | POA: Diagnosis not present

## 2024-05-16 DIAGNOSIS — M19141 Post-traumatic osteoarthritis, right hand: Secondary | ICD-10-CM | POA: Diagnosis not present

## 2024-05-29 DIAGNOSIS — S62616D Displaced fracture of proximal phalanx of right little finger, subsequent encounter for fracture with routine healing: Secondary | ICD-10-CM | POA: Diagnosis not present

## 2024-05-29 DIAGNOSIS — M25641 Stiffness of right hand, not elsewhere classified: Secondary | ICD-10-CM | POA: Diagnosis not present

## 2024-05-29 DIAGNOSIS — M79641 Pain in right hand: Secondary | ICD-10-CM | POA: Diagnosis not present

## 2024-08-09 ENCOUNTER — Ambulatory Visit (HOSPITAL_BASED_OUTPATIENT_CLINIC_OR_DEPARTMENT_OTHER): Admitting: Pulmonary Disease
